# Patient Record
Sex: Male | Born: 1960 | Race: White | Hispanic: No | Marital: Married | Smoking: Never smoker
Health system: Southern US, Community
[De-identification: ages and names within clinical notes are randomized; demographics above are authoritative.]

## PROBLEM LIST (undated history)

## (undated) DIAGNOSIS — R030 Elevated blood-pressure reading, without diagnosis of hypertension: Secondary | ICD-10-CM

## (undated) DIAGNOSIS — E663 Overweight: Secondary | ICD-10-CM

## (undated) DIAGNOSIS — E7800 Pure hypercholesterolemia, unspecified: Secondary | ICD-10-CM

## (undated) HISTORY — DX: Pure hypercholesterolemia, unspecified: E78.00

## (undated) HISTORY — PX: KNEE SURGERY: SHX244

## (undated) HISTORY — DX: Elevated blood-pressure reading, without diagnosis of hypertension: R03.0

## (undated) HISTORY — DX: Overweight: E66.3

---

## 2000-10-25 ENCOUNTER — Other Ambulatory Visit: Admission: RE | Admit: 2000-10-25 | Discharge: 2000-10-25 | Payer: Self-pay | Admitting: Gastroenterology

## 2000-10-25 ENCOUNTER — Encounter (INDEPENDENT_AMBULATORY_CARE_PROVIDER_SITE_OTHER): Payer: Self-pay

## 2005-09-18 ENCOUNTER — Encounter: Admission: RE | Admit: 2005-09-18 | Discharge: 2005-09-18 | Payer: Self-pay | Admitting: Sports Medicine

## 2005-10-02 ENCOUNTER — Encounter: Admission: RE | Admit: 2005-10-02 | Discharge: 2005-10-02 | Payer: Self-pay | Admitting: Sports Medicine

## 2008-01-31 ENCOUNTER — Emergency Department (HOSPITAL_COMMUNITY): Admission: EM | Admit: 2008-01-31 | Discharge: 2008-02-01 | Payer: Self-pay | Admitting: Emergency Medicine

## 2008-12-06 ENCOUNTER — Ambulatory Visit: Payer: Self-pay | Admitting: Internal Medicine

## 2008-12-06 ENCOUNTER — Observation Stay (HOSPITAL_COMMUNITY): Admission: AD | Admit: 2008-12-06 | Discharge: 2008-12-07 | Payer: Self-pay | Admitting: Internal Medicine

## 2008-12-06 DIAGNOSIS — I200 Unstable angina: Secondary | ICD-10-CM | POA: Insufficient documentation

## 2008-12-07 ENCOUNTER — Encounter: Payer: Self-pay | Admitting: Internal Medicine

## 2010-05-11 IMAGING — CR DG CHEST 2V
2 series · 2 of 2 positions shown · non-contrast
Comparison: No priors

CLINICAL DATA: Unstable angina

CHEST - 2 VIEW

[w chest pa *]
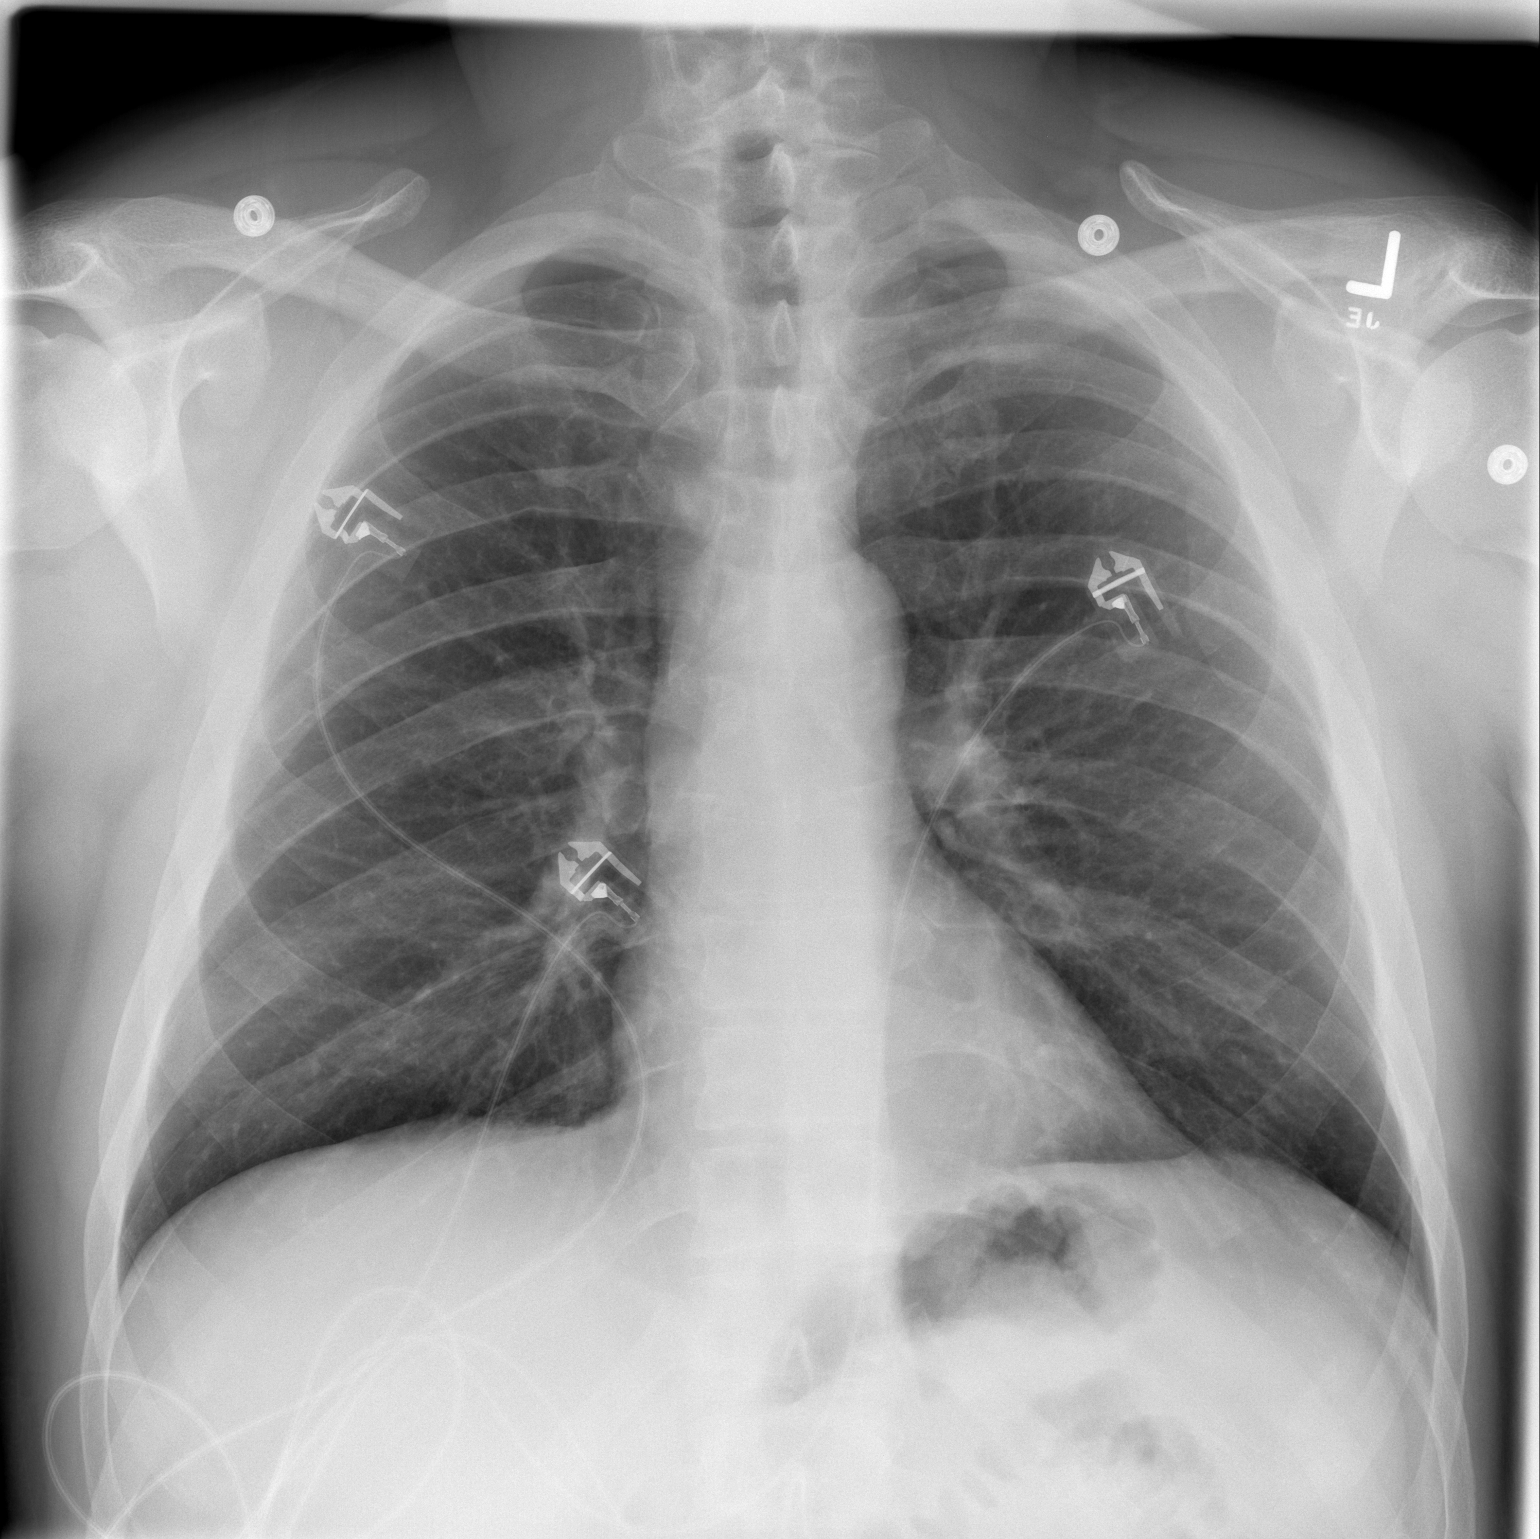

[w chest lat *]
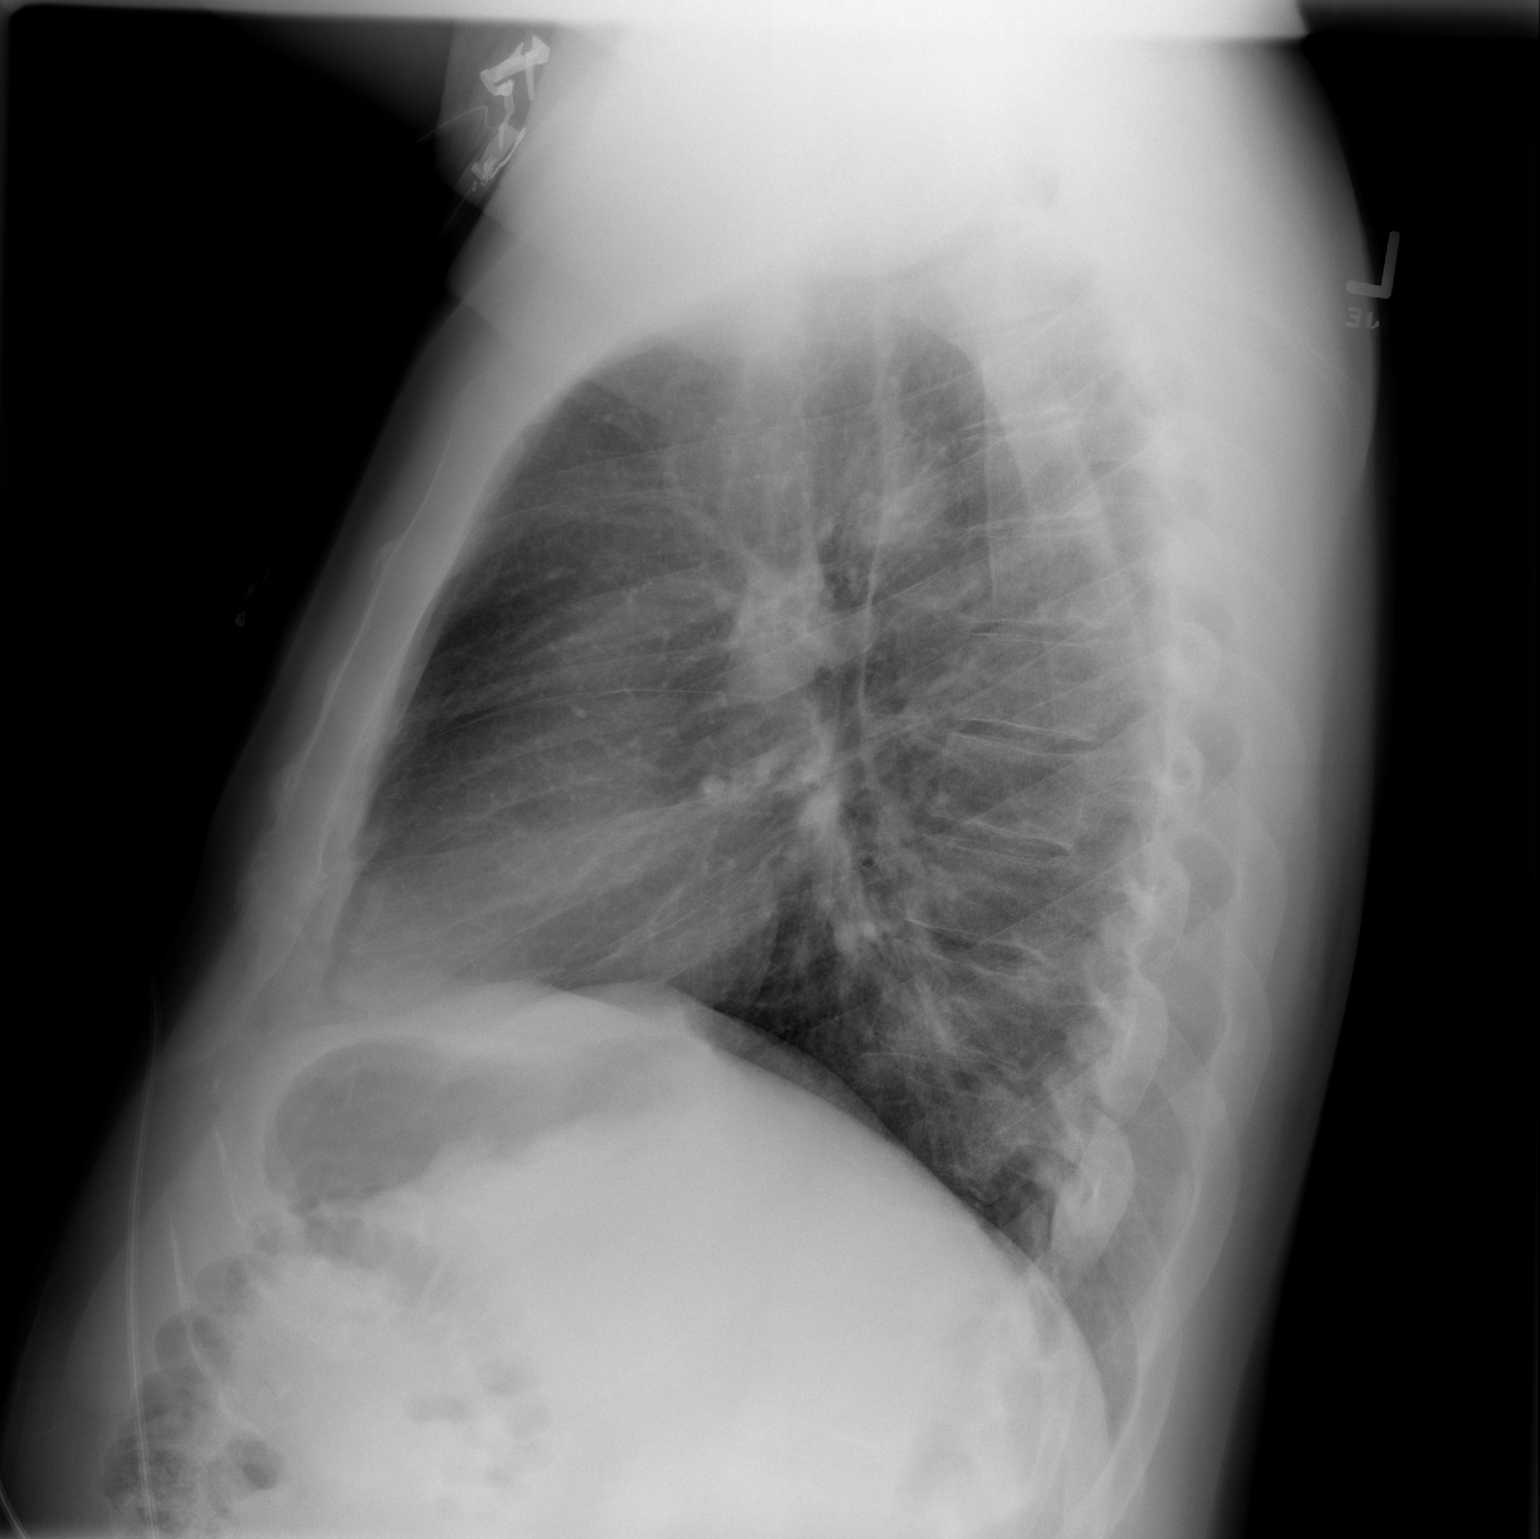

[2 of 2 positions shown; findings below may reference images not displayed]

FINDINGS: Heart and mediastinal contours normal.  Lungs clear.
Slight peribronchial thickening.  No pleural fluid or osseous
lesions.
IMPRESSION: Mild peribronchial thickening - no active disease.

## 2010-08-03 ENCOUNTER — Encounter: Payer: Self-pay | Admitting: Sports Medicine

## 2010-10-21 LAB — COMPREHENSIVE METABOLIC PANEL
ALT: 50 U/L (ref 0–53)
Alkaline Phosphatase: 74 U/L (ref 39–117)
Calcium: 9.3 mg/dL (ref 8.4–10.5)
GFR calc Af Amer: >60 mL/min (ref >60)
GFR calc non Af Amer: >60 mL/min (ref >60)
Total Bilirubin: 0.8 mg/dL (ref 0.3–1.2)
Total Protein: 7.8 g/dL (ref 6.0–8.3)

## 2010-10-21 LAB — CBC
HCT: 46.9 % (ref 39.0–52.0)
Hemoglobin: 16.5 g/dL (ref 13.0–17.0)
Platelets: 194 10*3/uL (ref 150–400)
RDW: 12.1 % (ref 11.5–15.5)

## 2010-10-21 LAB — CARDIAC PANEL(CRET KIN+CKTOT+MB+TROPI)
CK, MB: 2.4 ng/mL (ref 0.3–4.0)
CK, MB: 2.8 ng/mL (ref 0.3–4.0)
CK, MB: 3 ng/mL (ref 0.3–4.0)
Relative Index: 2.4 (ref 0.0–2.5)
Relative Index: 2.5 (ref 0.0–2.5)
Total CK: 112 U/L (ref 7–232)
Troponin I: 0.02 ng/mL (ref 0.00–0.06)

## 2010-10-21 LAB — LIPID PANEL
Cholesterol: 183 mg/dL (ref 0–200)
HDL: 23 mg/dL — ABNORMAL LOW (ref >39)
LDL Cholesterol: 123 mg/dL — ABNORMAL HIGH (ref 0–99)
Total CHOL/HDL Ratio: 8 RATIO
Triglycerides: 185 mg/dL — ABNORMAL HIGH (ref <150)
VLDL: 37 mg/dL (ref 0–40)

## 2010-11-25 NOTE — H&P (Signed)
Dakota Joseph, Dakota Joseph              ACCOUNT NO.:  [id number]   MEDICAL RECORD NO.:  [id number]          PATIENT TYPE:  OBV   LOCATION:  3706                         FACILITY:  [facility]   PHYSICIAN:  Sanda Linger, MD       DATE OF BIRTH:  15-Nov-1960   DATE OF ADMISSION:  12/06/2008  DATE OF DISCHARGE:                              HISTORY & PHYSICAL   CHIEF COMPLAINT:  Dyspnea on exertion.   HISTORY OF PRESENT ILLNESS:  This is a 50 year old male whom I am seeing  for the first time today.  He was previously cared for by Dr. Georgina Pillion at  [place].  He comes in today with a 49-month history of dyspnea on exertion,  substernal chest pain with an occasional radiation to the left upper  extremity.  His most recent concern is about a week or two ago.  He was  at work, he was at rest when he had the sudden onset of severe fatigue  with nausea, diaphoresis, and nearly fainting.  He saw the nurse at work  and was told that he had an irregular heart rate.  He states in the past  when he is pulling the trash can down the driveway, he will get short of  breath and he will be having squeezing pressure under his chest.  That  will go away after 30 minutes of rest.  He has had no cough or  hemoptysis.  He does not report any swelling in his legs.   CURRENT MEDICATIONS:  None.   ALLERGIES:  He has no known drug allergies.   PAST SURGICAL HISTORY:  Significant for tonsillectomy.   PAST MEDICAL HISTORY:  Remarkable for borderline elevation in his blood  pressure which has never been treated.   FAMILY HISTORY:  Positive for premature coronary artery disease in both  his father and mother and second-degree relatives.  There is a family  history of colon cancer, hypertension, and prostate cancer.   SOCIAL HISTORY:  He chews tobacco.  He rarely drinks alcohol.  Does not  smoke cigarettes.  He is married.  Denies use of illicit drugs.   REVIEW OF SYSTEMS:  Negative for any abdominal pain, hematuria, rash,  constipation, diarrhea, bright red blood per rectum, melena, loss of  appetite or weight loss.  He does endorse weight gain and a sedentary  lifestyle.   PHYSICAL EXAMINATION:  GENERAL:  An overweight male.  VITAL SIGNS:  His height is 76 inches, his weight is 262 pounds with a  BMI of 32.01, his pulse ox is 97%, temperature is 97.7, his pulse is 84  and regular, and his blood pressure is 150/90 on a large cuff.  HEENT:  Mucous membranes are pink and moist.  There is no icterus.  Nasopharynx, oropharynx, posterior pharynx show no lesions or exudate.  NECK:  Supple with no lymphadenopathy or JVD.  LUNGS:  Clear anteriorly and posteriorly.  CARDIOVASCULAR:  Regular rhythm without any murmur, rub, or gallop.  ABDOMEN:  Obese, otherwise benign with no hepatosplenomegaly, masses,  tenderness, palpation, rebound, or guarding.  SKIN:  Warm and moist  with no lesions.  EXTREMITIES:  No cyanosis, clubbing, or edema.  Pulses are 2+ and equal  throughout.  He has trace edema in the lower extremities.  Calves are  nontender and nonswollen.  NEUROLOGIC:  Nonfocal.  MUSCULOSKELETAL:  Within normal limits.   LABORATORY DATA:  His EKG shows that he is in normal sinus rhythm.  He  has insignificant Q-waves inferiorly.  He has some flattening of the T-  waves laterally.  There is no ST elevation and no significant Q-waves.   ASSESSMENT:  Unstable angina consistent with an intermediate coronary  syndrome.  He does have multiple risk factors including a family  history, history of hypertension, obesity, chewing tobacco, and  sedentary lifestyle.   PLAN:  He will be admitted to [facility] for telemetry.  He needs to be  monitored for dysrhythmias.  He has left atrial enlargement on the EKG.  So, I am concerned that he may have recently been in and out of atrial  fibrillation.  He also needs to be monitored for cardiac ischemia.  He  will undergo enzyme testing, echocardiogram, and Cardiolite testing.   Initial treatment will begin with IV beta-blocker, topical  nitroglycerin, aspirin, and Lovenox.  Lab testing has been ordered  including cardiac enzymes, CBC, CMP, urinalysis, and TSH.  He will have  a chest x-ray done today and a repeat EKG tomorrow.      Sanda Linger, MD  Electronically Signed     TJ/MEDQ  D:  12/06/2008  T:  12/07/2008  Job:  [id number]

## 2010-11-25 NOTE — Consult Note (Signed)
Dakota Joseph, Dakota Joseph              ACCOUNT NO.:  [id number]   MEDICAL RECORD NO.:  [id number]          PATIENT TYPE:  OBV   LOCATION:  3706                         FACILITY:  [facility]   PHYSICIAN:  Bevelyn Buckles. Bensimhon, MDDATE OF BIRTH:  1960-10-03   DATE OF CONSULTATION:  12/07/2008  DATE OF DISCHARGE:                                 CONSULTATION   CARDIOLOGIST:  Becky Sax. Bensimhon, MD   PRIMARY CARE PHYSICIAN:  Sanda Linger, MD with [facility].   Dakota Joseph is a 50 year old Caucasian gentleman with no history of  coronary artery disease who was actually somewhat active.  He plays  softball 4 nights a week and golf 2 times a week.  He presented on day  of admission complaining of left arm and toothache, some chest tightness  under his left breast, and some increased shortness of breath when he  takes the trash can from his house down to the end of his driveway,  which is up the hill.  He states he started feeling bad last week and  then on Thursday, he was at work, had been working a lot of overtime,  had an episode where he describes really weak, went to see the  occupational nurse.  He states he was told his blood pressure was  elevated like 140/90.  He saw Dr. Yetta Barre yesterday and was admitted for  further evaluation and currently is pain free.  Has had 2 sets of  enzymes that are negative.  Twelve-lead EKG showed no acute findings.   PAST MEDICAL HISTORY:  Borderline hypertension.  He also states that  occupational nurse told him his pulse was somewhat irregular at times.   SURGICAL HISTORY:  Tonsillectomy.  Denies any other health problems.  He  lives in [place] with his spouse.  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  He denies any illicit substance use or alcohol.  He  does chew tobacco some.  For exercise, he enjoys softball and golf.   Mother is alive and well.  Father deceased from complications of cancer.  Sibling alive and well.   REVIEW OF SYSTEMS:   Positive for chest discomfort, dyspnea on exertion,  left arm discomfort as described above with an episode of weakness.   ALLERGIES:  No known drug allergies.   CURRENT MEDICATIONS HERE:  1. Lovenox 40.  2. Lopressor 25 IV q.6 h.  3. Aspirin 325.  4. Nitroglycerin paste.  5. Protonix.   Note, nitroglycerin paste has been removed.  He was on no medications at  home.   PHYSICAL EXAMINATION:  VITAL SIGNS:  Temperature 96.9, heart rate 65,  respirations 16, blood pressure 119/72, sating 95% on room air.  GENERAL:  In no acute distress.  HEENT:  Unremarkable.  NECK:  Supple without lymphadenopathy, bruits, JVD.  CARDIOVASCULAR:  S1, S2.  Regular rate and rhythm.  LUNGS:  Clear to auscultation bilaterally.  SKIN:  Warm and dry.  ABDOMEN:  Soft, nontender.  Positive bowel sounds.  LOWER EXTREMITIES:  Without clubbing, cyanosis, or edema.  NEUROLOGIC:  Alert and oriented x3.   Chest x-ray,  no acute findings.  EKG, sinus rhythm at a rate of 81  without ST or T-wave changes.  Cardiac enzymes negative x2.  Hematocrit  46.9.  Potassium 3.8, creatinine 0.9, TSH 1.715.  Total cholesterol 183,  triglycerides 185, HDL 23, and LDL 123.   The patient with chest discomfort, mostly atypical.  Proceed with stress  Myoview today.  We will have the patient exercise on treadmill, if  negative, no further cardiac workup recommended.  Dr. Arvilla Meres  has been into examine and assess the patient and agrees with the plan of  care.      Dorian Pod, ACNP      Bevelyn Buckles. Bensimhon, MD  Electronically Signed    MB/MEDQ  D:  12/07/2008  T:  12/08/2008  Job:  [id number]

## 2010-11-25 NOTE — Discharge Summary (Signed)
Dakota Joseph, Dakota Joseph              ACCOUNT NO.:  [id number]   MEDICAL RECORD NO.:  [id number]          PATIENT TYPE:  OBV   LOCATION:  3706                         FACILITY:  [facility]   PHYSICIAN:  Valetta Mole. Swords, MD    DATE OF BIRTH:  1961-03-10   DATE OF ADMISSION:  12/06/2008  DATE OF DISCHARGE:  12/07/2008                               DISCHARGE SUMMARY   DISCHARGE DIAGNOSES:  1. Dyspnea.  2. Dyslipidemia.   DISCHARGE MEDICATIONS:  None.   HOSPITAL PROCEDURES:  Echocardiogram and stress Myoview, verbal report  normal.   HOSPITAL CONSULTS:  Cardiology, recommended stress Myoview.   The patient admitted to the Hospitalist Service on Dec 06, 2008.  See  admission note for details.  The patient had a nuclear stress testing on  Dec 07, 2008, demonstrated normal stress nuclear scan.  Results of  echocardiogram are pending.   HOSPITAL LABORATORIES:  Cardiac enzymes remained negative.  Lipid panel  with cholesterol 183, triglycerides 185, HDL 23, LDL 123.  TSH normal at  1.715.  CBC normal.  CMET normal except for the glucose of 111.   HOSPITAL COURSE:  The patient admitted to the Hospitalist Service on Dec 06, 2008.  See admission note for details.  The patient presented with  dyspnea.  He was seen by Cardiology, who recommended echocardiogram and  stress testing.  Stress test result is normal (verbal report).  Echocardiogram is pending.  The patient's symptoms resolved.  He is  ambulating in the hall without difficulty.  It is safe for the patient  to go home.  He will have outpatient followup with Dr. Yetta Barre.   CONDITION ON DISCHARGE:  Improved.      [name], MD  Electronically Signed     Valetta Mole. Swords, MD  Electronically Signed    BHS/MEDQ  D:  12/07/2008  T:  12/08/2008  Job:  [id number]

## 2011-04-10 LAB — URINALYSIS, ROUTINE W REFLEX MICROSCOPIC
Bilirubin Urine: NEGATIVE
Protein, ur: NEGATIVE
Specific Gravity, Urine: 1.024

## 2011-04-10 LAB — DIFFERENTIAL
Basophils Absolute: 0.4 — ABNORMAL HIGH
Basophils Relative: 2 — ABNORMAL HIGH
Eosinophils Absolute: 0
Eosinophils Relative: 0
Lymphs Abs: 1.4
Monocytes Relative: 9
Neutro Abs: 12.6 — ABNORMAL HIGH
Neutrophils Relative %: 80 — ABNORMAL HIGH

## 2011-04-10 LAB — CBC
HCT: 44.8
Hemoglobin: 15.7
MCHC: 35
MCV: 85.1
Platelets: 176
RDW: 11.7

## 2011-04-10 LAB — COMPREHENSIVE METABOLIC PANEL
GFR calc Af Amer: >60
GFR calc non Af Amer: >60
Glucose, Bld: 127 — ABNORMAL HIGH
Total Bilirubin: 1.3 — ABNORMAL HIGH

## 2011-04-10 LAB — LIPASE, BLOOD: Lipase: 17

## 2013-10-17 ENCOUNTER — Encounter (HOSPITAL_COMMUNITY): Payer: Self-pay | Admitting: Emergency Medicine

## 2013-10-17 ENCOUNTER — Emergency Department (HOSPITAL_COMMUNITY)
Admission: EM | Admit: 2013-10-17 | Discharge: 2013-10-17 | Disposition: A | Discharge status: Home / Self Care | Payer: 59 | Attending: Emergency Medicine | Admitting: Emergency Medicine

## 2013-10-17 DIAGNOSIS — Z79899 Other long term (current) drug therapy: Secondary | ICD-10-CM | POA: Insufficient documentation

## 2013-10-17 DIAGNOSIS — R5381 Other malaise: Secondary | ICD-10-CM | POA: Insufficient documentation

## 2013-10-17 DIAGNOSIS — R509 Fever, unspecified: Secondary | ICD-10-CM | POA: Insufficient documentation

## 2013-10-17 DIAGNOSIS — R739 Hyperglycemia, unspecified: Secondary | ICD-10-CM

## 2013-10-17 DIAGNOSIS — R7309 Other abnormal glucose: Secondary | ICD-10-CM | POA: Insufficient documentation

## 2013-10-17 DIAGNOSIS — R1084 Generalized abdominal pain: Secondary | ICD-10-CM | POA: Insufficient documentation

## 2013-10-17 DIAGNOSIS — R112 Nausea with vomiting, unspecified: Secondary | ICD-10-CM

## 2013-10-17 DIAGNOSIS — R197 Diarrhea, unspecified: Secondary | ICD-10-CM | POA: Insufficient documentation

## 2013-10-17 DIAGNOSIS — R5383 Other fatigue: Secondary | ICD-10-CM

## 2013-10-17 LAB — COMPREHENSIVE METABOLIC PANEL
ALT: 52 U/L (ref 0–53)
AST: 25 U/L (ref 0–37)
BUN: 22 mg/dL (ref 6–23)
Creatinine, Ser: 0.88 mg/dL (ref 0.50–1.35)
Potassium: 3.6 mEq/L — ABNORMAL LOW (ref 3.7–5.3)
Total Protein: 7.3 g/dL (ref 6.0–8.3)

## 2013-10-17 LAB — COMPREHENSIVE METABOLIC PANEL WITH GFR
Albumin: 4 g/dL (ref 3.5–5.2)
Alkaline Phosphatase: 75 U/L (ref 39–117)
CO2: 23 meq/L (ref 19–32)
Calcium: 9 mg/dL (ref 8.4–10.5)
Chloride: 100 meq/L (ref 96–112)
GFR calc Af Amer: >90 mL/min (ref >90)
GFR calc non Af Amer: >90 mL/min (ref >90)
Glucose, Bld: 152 mg/dL — ABNORMAL HIGH (ref 70–99)
Sodium: 137 meq/L (ref 137–147)
Total Bilirubin: 1.1 mg/dL (ref 0.3–1.2)

## 2013-10-17 LAB — CBC WITH DIFFERENTIAL/PLATELET
Basophils Absolute: 0 K/uL (ref 0.0–0.1)
Basophils Relative: 0 % (ref 0–1)
Eosinophils Absolute: 0 K/uL (ref 0.0–0.7)
Eosinophils Relative: 0 % (ref 0–5)
HCT: 47.3 % (ref 39.0–52.0)
Hemoglobin: 17.1 g/dL — ABNORMAL HIGH (ref 13.0–17.0)
Lymphocytes Relative: 3 % — ABNORMAL LOW (ref 12–46)
Lymphs Abs: 0.3 K/uL — ABNORMAL LOW (ref 0.7–4.0)
MCH: 30.3 pg (ref 26.0–34.0)
MCHC: 36.2 g/dL — ABNORMAL HIGH (ref 30.0–36.0)
MCV: 83.7 fL (ref 78.0–100.0)
Monocytes Absolute: 0.5 K/uL (ref 0.1–1.0)
Monocytes Relative: 5 % (ref 3–12)
Neutro Abs: 10.8 K/uL — ABNORMAL HIGH (ref 1.7–7.7)
Neutrophils Relative %: 93 % — ABNORMAL HIGH (ref 43–77)
Platelets: 165 10*3/uL (ref 150–400)
RBC: 5.65 MIL/uL (ref 4.22–5.81)
RDW: 12.2 % (ref 11.5–15.5)
WBC: 11.6 10*3/uL — ABNORMAL HIGH (ref 4.0–10.5)

## 2013-10-17 LAB — LIPASE, BLOOD: Lipase: 16 U/L (ref 11–59)

## 2013-10-17 MED ORDER — PANTOPRAZOLE SODIUM 40 MG PO TBEC: 40.0000 mg | DELAYED_RELEASE_TABLET | Freq: Every day | ORAL | Status: DC

## 2013-10-17 MED ORDER — FAMOTIDINE IN NACL 20-0.9 MG/50ML-% IV SOLN
20.0000 mg | Freq: Once | INTRAVENOUS | Status: AC
  Administered 2013-10-17: 20 mg via INTRAVENOUS
  Filled 2013-10-17: qty 50

## 2013-10-17 MED ORDER — ONDANSETRON 8 MG PO TBDP: 8.0000 mg | ORAL_TABLET | Freq: Two times a day (BID) | ORAL | Status: DC | PRN

## 2013-10-17 MED ORDER — SODIUM CHLORIDE 0.9 % IV BOLUS (SEPSIS): 1000.0000 mL | Freq: Once | INTRAVENOUS | Status: DC

## 2013-10-17 MED ORDER — ONDANSETRON HCL 4 MG/2ML IJ SOLN
4.0000 mg | Freq: Once | INTRAMUSCULAR | Status: AC
  Administered 2013-10-17: 4 mg via INTRAVENOUS
  Filled 2013-10-17: qty 2

## 2013-10-17 NOTE — ED Notes (Signed)
Pt presents with NAD. Pt c/o of presenting complaints since 6am. Generalized stomach pain and cramping.

## 2013-10-17 NOTE — ED Provider Notes (Signed)
CSN: [id number]     Arrival date & time 10/17/13  1842 History   First MD Initiated Contact with Patient 10/17/13 1943     Chief Complaint  Patient presents with  . Nausea  . Emesis  . Diarrhea  . Abdominal Pain  . Fever     (Consider location/radiation/quality/duration/timing/severity/associated sxs/prior Treatment) Patient is a 53 y.o. male presenting with vomiting, diarrhea, abdominal pain, and fever. The history is provided by the patient.  Emesis Severity:  Severe Duration:  12 hours Timing:  Constant Quality:  Stomach contents Feeding tolerance: nothing tolerated by mouth, tried liquids, water, juice, gatorade, sprite. Progression:  Unchanged Chronicity:  New Recent urination:  Decreased Context: not post-tussive and not self-induced   Relieved by:  Nothing Associated symptoms: abdominal pain and diarrhea   Associated symptoms: no chills, no fever and no myalgias   Abdominal pain:    Location:  Generalized   Quality:  Cramping   Severity:  Mild   Onset quality:  Gradual   Duration:  12 hours   Timing:  Sporadic   Progression:  Partially resolved   Chronicity:  New Diarrhea:    Quality:  Semi-solid   Severity:  Mild   Duration:  12 hours   Timing:  Intermittent   Progression:  Unchanged Risk factors: no alcohol use, no diabetes, no prior abdominal surgery, no sick contacts, no suspect food intake and no travel to endemic areas   Diarrhea Associated symptoms: abdominal pain and vomiting   Associated symptoms: no chills, no fever and no myalgias   Abdominal Pain Associated symptoms: diarrhea, fatigue, nausea and vomiting   Associated symptoms: no chills, no constipation and no fever   Fever Associated symptoms: diarrhea, nausea and vomiting   Associated symptoms: no chills and no myalgias     History reviewed. No pertinent past medical history. Past Surgical History  Procedure Laterality Date  . Knee surgery     History reviewed. No pertinent family  history. History  Substance Use Topics  . Smoking status: Never Smoker   . Smokeless tobacco: Not on file  . Alcohol Use: Yes    Review of Systems  Constitutional: Positive for fatigue. Negative for fever and chills.  Gastrointestinal: Positive for nausea, vomiting, abdominal pain and diarrhea. Negative for constipation and blood in stool.  Musculoskeletal: Negative for myalgias.  Neurological: Negative for dizziness, syncope and light-headedness.  All other systems reviewed and are negative.      Allergies  Review of patient's allergies indicates no known allergies.  Home Medications   Current Outpatient Rx  Name  Route  Sig  Dispense  Refill  . ibuprofen (ADVIL,MOTRIN) 200 MG tablet   Oral   Take 600 mg by mouth every 6 (six) hours as needed for moderate pain.         Marland Kitchen. ondansetron (ZOFRAN-ODT) 8 MG disintegrating tablet   Oral   Take 1 tablet (8 mg total) by mouth every 12 (twelve) hours as needed for nausea.   20 tablet   0   . pantoprazole (PROTONIX) 40 MG tablet   Oral   Take 1 tablet (40 mg total) by mouth daily.   14 tablet   0    BP 139/86  Pulse 105  Temp(Src) 97.6 F (36.4 C) (Oral)  Resp 16  Ht 6\' 5"  (1.956 m)  Wt 270 lb (122.471 kg)  BMI 32.01 kg/m2  SpO2 97% Physical Exam  Nursing note and vitals reviewed. Constitutional: He is oriented to person, place,  and time. He appears well-developed and well-nourished. No distress.  HENT:  Head: Normocephalic and atraumatic.  Eyes: Conjunctivae and EOM are normal. No scleral icterus.  Neck: Normal range of motion. Neck supple.  Cardiovascular: Normal rate, regular rhythm and intact distal pulses.   Pulmonary/Chest: Effort normal. No respiratory distress. He has no wheezes.  Abdominal: Soft. Bowel sounds are normal. He exhibits no distension. There is no tenderness. There is no rebound, no guarding and no CVA tenderness.  Neurological: He is alert and oriented to person, place, and time. Coordination  normal.  Skin: Skin is warm.  Psychiatric: He has a normal mood and affect.    ED Course  Procedures (including critical care time) Labs Review Labs Reviewed  CBC WITH DIFFERENTIAL - Abnormal; Notable for the following:    WBC 11.6 (*)    Hemoglobin 17.1 (*)    MCHC 36.2 (*)    Neutrophils Relative % 93 (*)    Neutro Abs 10.8 (*)    Lymphocytes Relative 3 (*)    Lymphs Abs 0.3 (*)    All other components within normal limits  COMPREHENSIVE METABOLIC PANEL - Abnormal; Notable for the following:    Potassium 3.6 (*)    Glucose, Bld 152 (*)    All other components within normal limits  LIPASE, BLOOD  URINALYSIS, ROUTINE W REFLEX MICROSCOPIC   Imaging Review No results found.   EKG Interpretation None     RA sat is 97% and I interpret to be adequate  10:14 PM Pt feels improved, is starting to sip on fluids without difficulty so far.  11:02 PM Pt contineus to feel imrpoved, tolerating PO's.  Will d/c home with Rx for protonix and zofran ODT.  Follow up with PMD in 3 days.     MDM   Final diagnoses:  Nausea vomiting and diarrhea  Hyperglycemia    Pt with soft abdomen, N/V/D for 1 day, no sig risks for bacterial infection.  Pt with no sig health concerns at baseline.  Will rehydrate with IVF's, IV antiemetics and will continue to monitor for improvement.  Likely a gastroenteritis.  No fever.  WBC of 11 is non specific.      Dakota Joseph. Shanisha Lech, MD 10/17/13 2302

## 2016-03-03 ENCOUNTER — Encounter: Payer: Self-pay | Admitting: Cardiology

## 2016-03-03 ENCOUNTER — Telehealth: Payer: Self-pay | Admitting: Cardiology

## 2016-03-03 NOTE — Telephone Encounter (Signed)
Called pt to inform him that we have not received his medical records. Pt stated that he would have his wife fax the records over to our office. I advised the pt that if he has any other problems, questions or concerns to call our office. Pt verbalized understanding.

## 2016-03-04 ENCOUNTER — Encounter: Payer: Self-pay | Admitting: Cardiology

## 2016-03-04 ENCOUNTER — Encounter (INDEPENDENT_AMBULATORY_CARE_PROVIDER_SITE_OTHER): Payer: Self-pay

## 2016-03-04 ENCOUNTER — Other Ambulatory Visit: Payer: Self-pay

## 2016-03-04 ENCOUNTER — Ambulatory Visit (INDEPENDENT_AMBULATORY_CARE_PROVIDER_SITE_OTHER): Payer: Commercial Managed Care - HMO | Admitting: Cardiology

## 2016-03-04 DIAGNOSIS — R0789 Other chest pain: Secondary | ICD-10-CM | POA: Diagnosis not present

## 2016-03-04 DIAGNOSIS — R0600 Dyspnea, unspecified: Secondary | ICD-10-CM | POA: Diagnosis not present

## 2016-03-04 NOTE — Patient Instructions (Signed)
We will schedule you for a stress test  

## 2016-03-04 NOTE — Addendum Note (Signed)
Addended by: Neoma LamingPUGH, CHERYL J on: 03/04/2016 04:20 PM   Modules accepted: Orders

## 2016-03-04 NOTE — Progress Notes (Signed)
Dictation #1 [id number]  [id number]    Cardiology Office Note    Date:  03/04/2016   ID:  Dakota DerbyMichael Lee Joseph, DOB 06/16/1961, MRN [id number]  PCP:  Sanda Lingerhomas Jones, MD  Cardiologist:  Peter SwazilandJordan, MD    History of Present Illness:  [name] is a 55 y.o. male self referred for evaluation of dyspnea on exertion. He has a history of borderline elevated blood pressure and hypercholesterolemia. He was evaluated in 2010 with a stress Myoview. He was able to walk 10 minutes on the Bruce protocol without chest pain or Ecg changes. Myoview images were normal. 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 This is more physical and he notes when going up and down steps he gets more SOB than before. He may have mild chest tightness. He sometimes has pain on the inner left arm but this is not exertional. He is fairly sedentary. Plays golf twice a week but otherwise no exercise. Usually does better when he can lose weight.    Past Medical History:  Diagnosis Date  . Borderline high blood pressure   . Hypercholesterolemia   . Overweight     Past Surgical History:  Procedure Laterality Date  . KNEE SURGERY      Current Medications: Outpatient Medications Prior to Visit  Medication Sig Dispense Refill  . ibuprofen (ADVIL,MOTRIN) 200 MG tablet Take 600 mg by mouth every 6 (six) hours as needed for moderate pain.    Marland Kitchen. ondansetron (ZOFRAN-ODT) 8 MG disintegrating tablet Take 1 tablet (8 mg total) by mouth every 12 (twelve) hours as needed for nausea. 20 tablet 0  . pantoprazole (PROTONIX) 40 MG tablet Take 1 tablet (40 mg total) by mouth daily. (Patient not taking: Reported on 03/04/2016) 14 tablet 0   No facility-administered medications prior to visit.      Allergies:   Review of patient's allergies indicates no known allergies.   Social History   Social History  . Marital status: Married    Spouse name: N/A  . Number of children: N/A  . Years of  education: N/A   Occupational History  . maintenance    Social History Main Topics  . Smoking status: Never Smoker  . Smokeless tobacco: Never Used  . Alcohol use Yes  . Drug use: No  . Sexual activity: Not Asked   Other Topics Concern  . None   Social History Narrative  . None     Family History:  The patient's family history includes Hypertension in his mother; Lung cancer in his father.   ROS:   Please see the history of present illness.    ROS All other systems reviewed and are negative.   PHYSICAL EXAM:   VS:  BP 132/84 (BP Location: Left Arm) Comment: 142/90 Right Arm  Pulse 77   Ht 6\' 5"  (1.956 m)   Wt 281 lb (127.5 kg)   BMI 33.32 kg/m    GEN: Well nourished, overweight, in no acute distress  HEENT: normal  Neck: no JVD, carotid bruits, or masses Cardiac: RRR; no murmurs, rubs, or gallops,no edema  Respiratory:  clear to auscultation bilaterally, normal work of breathing GI: soft, nontender, nondistended, + BS MS: no deformity or atrophy  Skin: warm and dry, no rash Neuro:  Alert and Oriented x 3, Strength and sensation are intact Psych: euthymic mood, full affect  Wt Readings from Last 3 Encounters:  03/04/16 281 lb (127.5 kg)  10/17/13 270 lb (122.5 kg)  12/06/08 (!) 262 lb (118.8 kg)      Studies/Labs Reviewed:   EKG:  EKG is ordered today.  The ekg ordered today demonstrates NSR with normal Ecg. I have personally reviewed and interpreted this study.   Recent Labs: No results found for requested labs within last 8760 hours.   Lipid Panel    Component Value Date/Time   CHOL  12/07/2008 0600    183        ATP III CLASSIFICATION:  <200     mg/dL   Desirable  841-324200-239  mg/dL   Borderline High  >=401>=240    mg/dL   High          TRIG [id number] (H) 12/07/2008 0600   HDL 23 (L) 12/07/2008 0600   CHOLHDL 8.0 12/07/2008 0600   VLDL 37 12/07/2008 0600   LDLCALC (H) 12/07/2008 0600    123        Total Cholesterol/HDL:CHD Risk Coronary Heart Disease  Risk Table                     Men   Women  1/2 Average Risk   3.4   3.3  Average Risk       5.0   4.4  2 X Average Risk   9.6   7.1  3 X Average Risk  23.4   11.0        Use the calculated Patient Ratio above and the CHD Risk Table to determine the patient's CHD Risk.        ATP III CLASSIFICATION (LDL):  <100     mg/dL   Optimal  253-664100-129  mg/dL   Near or Above                    Optimal  130-159  mg/dL   Borderline  403-474160-189  mg/dL   High  >259>190     mg/dL   Very High    Additional studies/ records that were reviewed today include:  none    ASSESSMENT:    1. Dyspnea   2. Atypical chest pain      PLAN:  In order of problems listed above:  1. Symptoms are somewhat atypical. I think this is probably related to deconditioning and weight. Will arrange an ETT to assess CV risk. Can compare to 2010. If no ischemia would recommend a regular aerobic exercise program and weight loss. Patient reports lab work done at [facility] but I cannot access. He is planning on follow up with Dr. Yetta BarreJones for primary care.     Medication Adjustments/Labs and Tests Ordered: Current medicines are reviewed at length with the patient today.  Concerns regarding medicines are outlined above.  Medication changes, Labs and Tests ordered today are listed in the Patient Instructions below. Patient Instructions  We will schedule you for a stress test     Signed, Peter SwazilandJordan, MD  03/04/2016 4:07 PM    [facility] [address], [place], [place], [id number] [phone]

## 2016-03-11 ENCOUNTER — Telehealth (HOSPITAL_COMMUNITY): Payer: Self-pay

## 2016-03-11 NOTE — Telephone Encounter (Signed)
Encounter complete. 

## 2016-03-13 ENCOUNTER — Ambulatory Visit (HOSPITAL_COMMUNITY)
Admission: RE | Admit: 2016-03-13 | Discharge: 2016-03-13 | Disposition: A | Discharge status: Home / Self Care | Payer: Commercial Managed Care - HMO | Source: Ambulatory Visit | Attending: Cardiovascular Disease | Admitting: Cardiovascular Disease

## 2016-03-13 DIAGNOSIS — R0600 Dyspnea, unspecified: Secondary | ICD-10-CM

## 2016-03-13 DIAGNOSIS — R0789 Other chest pain: Secondary | ICD-10-CM | POA: Diagnosis not present

## 2016-03-13 DIAGNOSIS — R9439 Abnormal result of other cardiovascular function study: Secondary | ICD-10-CM | POA: Insufficient documentation

## 2016-03-13 LAB — EXERCISE TOLERANCE TEST
CSEPED: 10 min
CSEPEW: 11.7 METS
CSEPHR: 98 %
Exercise duration (sec): 0 s
MPHR: 168 {beats}/min
Peak HR: 166 {beats}/min
RPE: 17
Rest HR: 71 {beats}/min

## 2016-03-18 ENCOUNTER — Telehealth: Payer: Self-pay

## 2016-03-18 NOTE — Telephone Encounter (Signed)
Spoke to patient.Dr.Jordan advised follow up with him in 1 year.Advised to call sooner if needed.

## 2017-04-15 NOTE — Progress Notes (Deleted)
Dictation #1 [id number]  [id number]    Cardiology Office Note    Date:  04/15/2017   ID:  Dakota Joseph, Dakota Joseph 26-Feb-1961, MRN [id number]  PCP:  Etta Grandchild, MD  Cardiologist:  Hani Campusano [place], MD    History of Present Illness:  Dakota Joseph is a 56 y.o. male was self referred for evaluation of dyspnea on exertion. He has a history of borderline elevated blood pressure and hypercholesterolemia. He was evaluated in 2010 with a stress Myoview. He was able to walk 10 minutes on the Bruce protocol without chest pain or Ecg changes. Myoview images were normal. 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 This is more physical and he notes when going up and down steps he gets more SOB than before. He may have mild chest tightness. He sometimes has pain on the inner left arm but this is not exertional. He is fairly sedentary. Plays golf twice a week but otherwise no exercise. Usually does better when he can lose weight.   ETT was performed last year and was stable compared to 2010. Symptoms felt to be related to deconditioning.    Past Medical History:  Diagnosis Date  . Borderline high blood pressure   . Hypercholesterolemia   . Overweight     Past Surgical History:  Procedure Laterality Date  . KNEE SURGERY      Current Medications: Outpatient Medications Prior to Visit  Medication Sig Dispense Refill  . ibuprofen (ADVIL,MOTRIN) 200 MG tablet Take 600 mg by mouth every 6 (six) hours as needed for moderate pain.     No facility-administered medications prior to visit.      Allergies:   Patient has no known allergies.   Social History   Social History  . Marital status: Married    Spouse name: N/A  . Number of children: N/A  . Years of education: N/A   Occupational History  . maintenance    Social History Main Topics  . Smoking status: Never Smoker  . Smokeless tobacco: Never Used  . Alcohol use Yes  . Drug use: No  . Sexual  activity: Not on file   Other Topics Concern  . Not on file   Social History Narrative  . No narrative on file     Family History:  The patient's family history includes Hypertension in his mother; Lung cancer in his father.   ROS:   Please see the history of present illness.    ROS All other systems reviewed and are negative.   PHYSICAL EXAM:   VS:  There were no vitals taken for this visit.   GEN: Well nourished, overweight, in no acute distress  HEENT: normal  Neck: no JVD, carotid bruits, or masses Cardiac: RRR; no murmurs, rubs, or gallops,no edema  Respiratory:  clear to auscultation bilaterally, normal work of breathing GI: soft, nontender, nondistended, + BS MS: no deformity or atrophy  Skin: warm and dry, no rash Neuro:  Alert and Oriented x 3, Strength and sensation are intact Psych: euthymic mood, full affect  Wt Readings from Last 3 Encounters:  03/04/16 281 lb (127.5 kg)  10/17/13 270 lb (122.5 kg)  12/06/08 (!) 262 lb (118.8 kg)      Studies/Labs Reviewed:   EKG:  EKG is ordered today.  The ekg ordered today demonstrates NSR with normal Ecg. I have personally reviewed and interpreted this study.   Recent Labs: No results found for requested labs within last  8760 hours.   Lipid Panel    Component Value Date/Time   CHOL  12/07/2008 0600    183        ATP III CLASSIFICATION:  <200     mg/dL   Desirable  161-096  mg/dL   Borderline High  >=045    mg/dL   High          TRIG 409 (H) 12/07/2008 0600   HDL 23 (L) 12/07/2008 0600   CHOLHDL 8.0 12/07/2008 0600   VLDL 37 12/07/2008 0600   LDLCALC (H) 12/07/2008 0600    123        Total Cholesterol/HDL:CHD Risk Coronary Heart Disease Risk Table                     Men   Women  1/2 Average Risk   3.4   3.3  Average Risk       5.0   4.4  2 X Average Risk   9.6   7.1  3 X Average Risk  23.4   11.0        Use the calculated Patient Ratio above and the CHD Risk Table to determine the patient's CHD  Risk.        ATP III CLASSIFICATION (LDL):  <100     mg/dL   Optimal  811-914  mg/dL   Near or Above                    Optimal  130-159  mg/dL   Borderline  782-956  mg/dL   High  >213     mg/dL   Very High    Additional studies/ records that were reviewed today include:  ETT 03/13/16:  Notes Recorded by Chriss Mannan M [place], MD on 03/13/2016 at 4:41 PM EDT ETT is stable compared to stress test at the time of a Myoview study in 2010. No chest pain. Hypertensive BP response is indicative of poor cardiovascular conditioning. Recommend regular (daily) aerobic exercise.   Molina Hollenback [place] MD, [facility]      Vitals   Height Weight BMI (Calculated)   (1.956 m) 281 lb (127.5 kg) 33.4  Study Highlights     Blood pressure demonstrated a hypertensive response to exercise.  Horizontal ST segment depression ST segment depression was noted during stress in the V6 leads.   HTN response to exercise 1 mm horizontal ST depression in lead V6 peak stress Consider f/u Cardiac CTA or perfusion study  Note this was in setting of HTN response        ASSESSMENT:    No diagnosis found.   PLAN:  In order of problems listed above:  1. Symptoms are somewhat atypical. I think this is probably related to deconditioning and weight. Will arrange an ETT to assess CV risk. Can compare to 2010. If no ischemia would recommend a regular aerobic exercise program and weight loss. Patient reports lab work done at [facility] but I cannot access. He is planning on follow up with Dr. Yetta Barre for primary care.     Medication Adjustments/Labs and Tests Ordered: Current medicines are reviewed at length with the patient today.  Concerns regarding medicines are outlined above.  Medication changes, Labs and Tests ordered today are listed in the Patient Instructions below. There are no Patient Instructions on file for this visit.   Signed, Zinnia Tindall [place], MD  04/15/2017 7:24 AM    [facility] [address], [place], [place],  [address] [phone]  

## 2017-04-19 ENCOUNTER — Ambulatory Visit: Payer: Commercial Managed Care - HMO | Admitting: Cardiology

## 2017-05-04 ENCOUNTER — Encounter: Payer: Self-pay | Admitting: Cardiology

## 2017-05-19 NOTE — Progress Notes (Signed)
Dictation #1 [id number]  [id number]    Cardiology Office Note    Date:  05/20/2017   ID:  Cobi, Delph Feb 22, 1961, MRN [id number]  PCP:  Etta Grandchild, MD  Cardiologist:  Dandra Shambaugh [place], MD    History of Present Illness:  Kolt Mcwhirter is a 56 y.o. male seen for follow up of dyspnea on exertion. He has a history of borderline elevated blood pressure and hypercholesterolemia. He was evaluated in 2010 with a stress Myoview. He was able to walk 10 minutes on the Bruce protocol without chest pain or Ecg changes. Myoview images were normal. When seen last year he complained of dyspnea on exertion and some chest tightness. We ordered an ETT and demonstrated a hypertensive response with exercise but really unchanged from 2010. This was felt to be indicative of poor conditioning. Regular exercise and weight loss recommended.   Since last year he has really changed his diet. Quit eating sausage/egg biscuits for breakfast and hamburgers and fries at lunch. Not drinking sodas. Eating oatmeal in am and salad at lunch. No longer craving the fatty foods. Has lost 10 lbs. Still hasn't increased aerobic activity. Playing golf mostly.    Past Medical History:  Diagnosis Date  . Borderline high blood pressure   . Hypercholesterolemia   . Overweight     Past Surgical History:  Procedure Laterality Date  . KNEE SURGERY      Current Medications: Outpatient Medications Prior to Visit  Medication Sig Dispense Refill  . ibuprofen (ADVIL,MOTRIN) 200 MG tablet Take 600 mg by mouth every 6 (six) hours as needed for moderate pain.     No facility-administered medications prior to visit.      Allergies:   Patient has no known allergies.   Social History   Socioeconomic History  . Marital status: Married    Spouse name: None  . Number of children: None  . Years of education: None  . Highest education level: None  Social Needs  . Financial resource strain: None  . Food  insecurity - worry: None  . Food insecurity - inability: None  . Transportation needs - medical: None  . Transportation needs - non-medical: None  Occupational History  . Occupation: maintenance  Tobacco Use  . Smoking status: Never Smoker  . Smokeless tobacco: Never Used  Substance and Sexual Activity  . Alcohol use: Yes  . Drug use: No  . Sexual activity: None  Other Topics Concern  . None  Social History Narrative  . None     Family History:  The patient's family history includes Hypertension in his mother; Lung cancer in his father.   ROS:   Please see the history of present illness.    ROS All other systems reviewed and are negative.   PHYSICAL EXAM:   VS:  BP (!) 162/96   Pulse 70   Ht 6\' 4"  (1.93 m)   Wt 270 lb 12.8 oz (122.8 kg)   BMI 32.96 kg/m    GENERAL:  Well appearing, overweight HEENT:  PERRL, EOMI, sclera are clear. Oropharynx is clear. NECK:  No jugular venous distention, carotid upstroke brisk and symmetric, no bruits, no thyromegaly or adenopathy LUNGS:  Clear to auscultation bilaterally CHEST:  Unremarkable HEART:  RRR,  PMI not displaced or sustained,S1 and S2 within normal limits, no S3, no S4: no clicks, no rubs, no murmurs ABD:  Soft, nontender. BS +, no masses or bruits. No hepatomegaly, no splenomegaly EXT:  2 + pulses  throughout, no edema, no cyanosis no clubbing SKIN:  Warm and dry.  No rashes NEURO:  Alert and oriented x 3. Cranial nerves II through XII intact. PSYCH:  Cognitively intact    Wt Readings from Last 3 Encounters:  05/20/17 270 lb 12.8 oz (122.8 kg)  03/04/16 281 lb (127.5 kg)  10/17/13 270 lb (122.5 kg)      Studies/Labs Reviewed:   EKG:  EKG is  ordered today.  The ekg ordered today demonstrates NSR with normal Ecg. I have personally reviewed and interpreted this study.   Recent Labs: No results found for requested labs within last 8760 hours.   Lipid Panel    Component Value Date/Time   CHOL  12/07/2008 0600      183        ATP III CLASSIFICATION:  <200     mg/dL   Desirable  161-096200-239  mg/dL   Borderline High  >=045>=240    mg/dL   High          TRIG 409185 (H) 12/07/2008 0600   HDL 23 (L) 12/07/2008 0600   CHOLHDL 8.0 12/07/2008 0600   VLDL 37 12/07/2008 0600   LDLCALC (H) 12/07/2008 0600    123        Total Cholesterol/HDL:CHD Risk Coronary Heart Disease Risk Table                     Men   Women  1/2 Average Risk   3.4   3.3  Average Risk       5.0   4.4  2 X Average Risk   9.6   7.1  3 X Average Risk  23.4   11.0        Use the calculated Patient Ratio above and the CHD Risk Table to determine the patient's CHD Risk.        ATP III CLASSIFICATION (LDL):  <100     mg/dL   Optimal  811-914100-129  mg/dL   Near or Above                    Optimal  130-159  mg/dL   Borderline  782-956160-189  mg/dL   High  >213>190     mg/dL   Very High    Additional studies/ records that were reviewed today include:  ETT 03/13/16: Study Highlights     Blood pressure demonstrated a hypertensive response to exercise.  Horizontal ST segment depression ST segment depression was noted during stress in the V6 leads.   HTN response to exercise 1 mm horizontal ST depression in lead V6 peak stress Consider f/u Cardiac CTA or perfusion study  Note this was in setting of HTN response       ASSESSMENT:    1. Dyspnea, unspecified type      PLAN:  In order of problems listed above:  1. ETT last year was stable from 2010 but did show deconditioning with hypertensive BP response. He can tell a big difference in his dyspnea with his weight loss. Goal now is to increase his aerobic activity. I will follow up in one year.    Medication Adjustments/Labs and Tests Ordered: Current medicines are reviewed at length with the patient today.  Concerns regarding medicines are outlined above.  Medication changes, Labs and Tests ordered today are listed in the Patient Instructions below. Patient Instructions  Continue your  current therapy  Focus on increased aerobic activity  Signed, Benney Sommerville [place], MD  05/20/2017 8:31 AM    [facility] [address], [place], [place], [id number] [phone]

## 2017-05-20 ENCOUNTER — Ambulatory Visit: Payer: 59 | Admitting: Cardiology

## 2017-05-20 ENCOUNTER — Encounter: Payer: Self-pay | Admitting: Cardiology

## 2017-05-20 VITALS — BP 162/96 | HR 70 | Ht 76.0 in | Wt 270.8 lb

## 2017-05-20 DIAGNOSIS — R0600 Dyspnea, unspecified: Secondary | ICD-10-CM

## 2017-05-20 NOTE — Patient Instructions (Signed)
Continue your current therapy  Focus on increased aerobic activity

## 2019-05-02 ENCOUNTER — Telehealth: Payer: Self-pay | Admitting: Cardiology

## 2019-05-02 NOTE — Telephone Encounter (Signed)
LMTCB to schedule appt with Dr. Jordan. 

## 2019-05-22 NOTE — Progress Notes (Signed)
Dictation #1 [id number]  [id number]    Cardiology Office Note    Date:  05/24/2019   ID:  Dakota Joseph, Dakota Joseph 12-09-1960, MRN [id number]  PCP:  Patient, No Pcp Per  Cardiologist:  Jadalee Westcott [place], MD    History of Present Illness:  Dakota Joseph is a 58 y.o. male seen for follow up. Last seen 2 years ago. He has a history of borderline elevated blood pressure and hypercholesterolemia. He was evaluated in 2010 with a stress Myoview. He was able to walk 10 minutes on the Bruce protocol without chest pain or Ecg changes. Myoview images were normal. When seen In 2017 he complained of dyspnea on exertion and some chest tightness. We ordered an ETT and demonstrated a hypertensive response with exercise but really unchanged from 2010. This was felt to be indicative of poor conditioning. Regular exercise and weight loss recommended.   When last seen he had changed his diet, lost weight and felt much better.  On follow up today he is very stressed out at work. No breaks or vacation. 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 Is eating poorly again and has gained 15 lbs. Feels terrible. Having chest pressure and tightness with stress. Can't sleep. SOB going up stairs. Doesn't think he can handle his job much longer.    Past Medical History:  Diagnosis Date  . Borderline high blood pressure   . Hypercholesterolemia   . Overweight     Past Surgical History:  Procedure Laterality Date  . KNEE SURGERY      Current Medications: Outpatient Medications Prior to Visit  Medication Sig Dispense Refill  . ibuprofen (ADVIL,MOTRIN) 200 MG tablet Take 600 mg by mouth every 6 (six) hours as needed for moderate pain.     No facility-administered medications prior to visit.      Allergies:   Patient has no known allergies.   Social History   Socioeconomic History  . Marital status: Married    Spouse name: Not on file  . Number of children: Not on file  . Years of education:  Not on file  . Highest education level: Not on file  Occupational History  . Occupation: maintenance  Social Needs  . Financial resource strain: Not on file  . Food insecurity    Worry: Not on file    Inability: Not on file  . Transportation needs    Medical: Not on file    Non-medical: Not on file  Tobacco Use  . Smoking status: Never Smoker  . Smokeless tobacco: Never Used  Substance and Sexual Activity  . Alcohol use: Yes  . Drug use: No  . Sexual activity: Not on file  Lifestyle  . Physical activity    Days per week: Not on file    Minutes per session: Not on file  . Stress: Not on file  Relationships  . Social Musician on phone: Not on file    Gets together: Not on file    Attends religious service: Not on file    Active member of club or organization: Not on file    Attends meetings of clubs or organizations: Not on file    Relationship status: Not on file  Other Topics Concern  . Not on file  Social History Narrative  . Not on file     Family History:  The patient's family history includes Hypertension in his mother; Lung cancer in his father.   ROS:  Please see the history of present illness.    ROS All other systems reviewed and are negative.   PHYSICAL EXAM:   VS:  BP (!) 161/104 (BP Location: Right Arm)   Pulse 80   Temp (!) 97 F (36.1 C)   Ht 6\' 4"  (1.93 m)   Wt 282 lb 3.2 oz (128 kg)   SpO2 98%   BMI 34.35 kg/m    GENERAL:  Well appearing, overweight HEENT:  PERRL, EOMI, sclera are clear. Oropharynx is clear. NECK:  No jugular venous distention, carotid upstroke brisk and symmetric, no bruits, no thyromegaly or adenopathy LUNGS:  Clear to auscultation bilaterally CHEST:  Unremarkable HEART:  RRR,  PMI not displaced or sustained,S1 and S2 within normal limits, no S3, no S4: no clicks, no rubs, no murmurs ABD:  Soft, nontender. BS +, no masses or bruits. No hepatomegaly, no splenomegaly EXT:  2 + pulses throughout, no edema, no  cyanosis no clubbing SKIN:  Warm and dry.  No rashes NEURO:  Alert and oriented x 3. Cranial nerves II through XII intact. PSYCH:  Cognitively intact    Wt Readings from Last 3 Encounters:  05/24/19 282 lb 3.2 oz (128 kg)  05/20/17 270 lb 12.8 oz (122.8 kg)  03/04/16 281 lb (127.5 kg)      Studies/Labs Reviewed:   EKG:  EKG is  ordered today.  The ekg ordered today demonstrates NSR rate 76. Normal. I have personally reviewed and interpreted this study.    Recent Labs: No results found for requested labs within last 8760 hours.   Lipid Panel    Component Value Date/Time   CHOL  12/07/2008 0600    183        ATP III CLASSIFICATION:  <200     mg/dL   Desirable  200-239  mg/dL   Borderline High  >=240    mg/dL   High          TRIG 185 (H) 12/07/2008 0600   HDL 23 (L) 12/07/2008 0600   CHOLHDL 8.0 12/07/2008 0600   VLDL 37 12/07/2008 0600   LDLCALC (H) 12/07/2008 0600    123        Total Cholesterol/HDL:CHD Risk Coronary Heart Disease Risk Table                     Men   Women  1/2 Average Risk   3.4   3.3  Average Risk       5.0   4.4  2 X Average Risk   9.6   7.1  3 X Average Risk  23.4   11.0        Use the calculated Patient Ratio above and the CHD Risk Table to determine the patient's CHD Risk.        ATP III CLASSIFICATION (LDL):  <100     mg/dL   Optimal  100-129  mg/dL   Near or Above                    Optimal  130-159  mg/dL   Borderline  160-189  mg/dL   High  >190     mg/dL   Very High    Additional studies/ records that were reviewed today include:  ETT 03/13/16: Study Highlights     Blood pressure demonstrated a hypertensive response to exercise.  Horizontal ST segment depression ST segment depression was noted during stress in the V6 leads.   HTN response  to exercise 1 mm horizontal ST depression in lead V6 peak stress Consider f/u Cardiac CTA or perfusion study  Note this was in setting of HTN response       ASSESSMENT:    1.  Precordial chest pain   2. Essential hypertension   3. Dyslipidemia      PLAN:  In order of problems listed above: I have given him an excuse to be out of work for 3 weeks. Will check blood work today. Initiate losartan 50 mg daily for BP. Work on getting back to a healthy diet and weight loss. Will arrange for a coronary CTA to evaluate his chest pain.    Medication Adjustments/Labs and Tests Ordered: Current medicines are reviewed at length with the patient today.  Concerns regarding medicines are outlined above.  Medication changes, Labs and Tests ordered today are listed in the Patient Instructions below. Patient Instructions  Start losartan 50 mg daily for blood pressure  We will check labs today  We will schedule you for a CT angiogram to look at the coronary arteries.   Start working on a healthier diet, weight loss and some exercise.     Signed, Onofrio Klemp [place], MD  05/24/2019 3:14 PM    [facility] [address], [place], [place], [id number] [phone]

## 2019-05-24 ENCOUNTER — Encounter: Payer: Self-pay | Admitting: Cardiology

## 2019-05-24 ENCOUNTER — Ambulatory Visit (INDEPENDENT_AMBULATORY_CARE_PROVIDER_SITE_OTHER): Payer: 59 | Admitting: Cardiology

## 2019-05-24 ENCOUNTER — Other Ambulatory Visit: Payer: Self-pay

## 2019-05-24 VITALS — BP 161/104 | HR 80 | Temp 97.0°F | Ht 76.0 in | Wt 282.2 lb

## 2019-05-24 DIAGNOSIS — I10 Essential (primary) hypertension: Secondary | ICD-10-CM

## 2019-05-24 DIAGNOSIS — R072 Precordial pain: Secondary | ICD-10-CM

## 2019-05-24 DIAGNOSIS — E785 Hyperlipidemia, unspecified: Secondary | ICD-10-CM

## 2019-05-24 MED ORDER — METOPROLOL TARTRATE 100 MG PO TABS: ORAL_TABLET | ORAL | 0 refills | Status: DC

## 2019-05-24 MED ORDER — LOSARTAN POTASSIUM 50 MG PO TABS: 50.0000 mg | ORAL_TABLET | Freq: Every day | ORAL | 3 refills | Status: AC

## 2019-05-24 NOTE — Patient Instructions (Addendum)
Start losartan 50 mg daily for blood pressure  We will check labs today  We will schedule you for a CT angiogram to look at the coronary arteries.   Start working on a healthier diet, weight loss and some exercise.       Your cardiac CT will be scheduled at one of the below locations:   [facility] [address] [place], [place] [address] (336) [place] [address] [place], [place] [address] [phone]  If scheduled at [facility], please arrive at the [facility] main entrance of [facility] 30-45 minutes prior to test start time. Proceed to the [facility] Radiology Department (first floor) to check-in and test prep.  If scheduled at [facility], please arrive 15 mins early for check-in and test prep.  Please follow these instructions carefully (unless otherwise directed):  Hold all erectile dysfunction medications at least 3 days (72 hrs) prior to test.  On the Night Before the Test: . Be sure to Drink plenty of water. . Do not consume any caffeinated/decaffeinated beverages or chocolate 12 hours prior to your test. . Do not take any antihistamines 12 hours prior to your test.  On the Day of the Test: . Drink plenty of water. Do not drink any water within one hour of the test. . Do not eat any food 4 hours prior to the test. . You may take your regular medications prior to the test.  . Take metoprolol 100 mg two hours prior to test.         After the Test: . Drink plenty of water. . After receiving IV contrast, you may experience a mild flushed feeling. This is normal. . On occasion, you may experience a mild rash up to 24 hours after the test. This is not dangerous. If this occurs, you can take Benadryl 25 mg and increase your fluid intake. . If you experience trouble breathing, this can be serious. If it is severe call 911 IMMEDIATELY. If  it is mild, please call our office.    Once we have confirmed authorization from your insurance company, we will call you to set up a date and time for your test.   For non-scheduling related questions, please contact the cardiac imaging nurse navigator should you have any questions/concerns: Marchia Bond, RN Navigator Cardiac Imaging [facility] [phone] Office

## 2019-05-25 LAB — BASIC METABOLIC PANEL
BUN/Creatinine Ratio: 13 (ref 9–20)
BUN: 13 mg/dL (ref 6–24)
CO2: 25 mmol/L (ref 20–29)
Calcium: 9.6 mg/dL (ref 8.7–10.2)
Chloride: 101 mmol/L (ref 96–106)
Creatinine, Ser: 1.04 mg/dL (ref 0.76–1.27)
GFR calc Af Amer: 92 mL/min/{1.73_m2} (ref >59)
GFR calc non Af Amer: 80 mL/min/{1.73_m2} (ref >59)
Glucose: 93 mg/dL (ref 65–99)
Potassium: 4.6 mmol/L (ref 3.5–5.2)
Sodium: 141 mmol/L (ref 134–144)

## 2019-05-25 LAB — CBC WITH DIFFERENTIAL/PLATELET
Basophils Absolute: 0 10*3/uL (ref 0.0–0.2)
Basos: 0 %
EOS (ABSOLUTE): 0.1 10*3/uL (ref 0.0–0.4)
Eos: 1 %
Hematocrit: 49.6 % (ref 37.5–51.0)
Hemoglobin: 16.7 g/dL (ref 13.0–17.7)
Immature Grans (Abs): 0 10*3/uL (ref 0.0–0.1)
Immature Granulocytes: 0 %
Lymphocytes Absolute: 2.6 10*3/uL (ref 0.7–3.1)
Lymphs: 30 %
MCH: 29.9 pg (ref 26.6–33.0)
MCHC: 33.7 g/dL (ref 31.5–35.7)
MCV: 89 fL (ref 79–97)
Monocytes Absolute: 0.7 10*3/uL (ref 0.1–0.9)
Monocytes: 8 %
Neutrophils Absolute: 5 10*3/uL (ref 1.4–7.0)
Neutrophils: 61 %
Platelets: 198 10*3/uL (ref 150–450)
RBC: 5.59 x10E6/uL (ref 4.14–5.80)
RDW: 12.1 % (ref 11.6–15.4)
WBC: 8.4 10*3/uL (ref 3.4–10.8)

## 2019-05-25 LAB — HEPATIC FUNCTION PANEL
ALT: 50 IU/L — ABNORMAL HIGH (ref 0–44)
AST: 28 IU/L (ref 0–40)
Albumin: 4.7 g/dL (ref 3.8–4.9)
Alkaline Phosphatase: 82 IU/L (ref 39–117)
Bilirubin Total: 0.6 mg/dL (ref 0.0–1.2)
Bilirubin, Direct: 0.12 mg/dL (ref 0.00–0.40)
Total Protein: 7.4 g/dL (ref 6.0–8.5)

## 2019-05-25 LAB — LIPID PANEL
Chol/HDL Ratio: 7.9 ratio — ABNORMAL HIGH (ref 0.0–5.0)
Cholesterol, Total: 228 mg/dL — ABNORMAL HIGH (ref 100–199)
HDL: 29 mg/dL — ABNORMAL LOW (ref >39)
LDL Chol Calc (NIH): 119 mg/dL — ABNORMAL HIGH (ref 0–99)
Triglycerides: 455 mg/dL — ABNORMAL HIGH (ref 0–149)
VLDL Cholesterol Cal: 80 mg/dL — ABNORMAL HIGH (ref 5–40)

## 2019-05-25 LAB — TSH: TSH: 2.55 u[IU]/mL (ref 0.450–4.500)

## 2019-06-14 ENCOUNTER — Telehealth: Payer: Self-pay | Admitting: Cardiology

## 2019-06-14 NOTE — Telephone Encounter (Signed)
Routed to primary nurse 

## 2019-06-14 NOTE — Telephone Encounter (Signed)
Dr. Martinique wrote the patient a letter to be cleared from work until December 7. The wife wants to know if Dr. Martinique can extend that letter until January 4. The patient still has not had any of his testing done yet and does not want to go back to work until he is done with the tests.  The patient is trying to finalize papers with his work and Medical Records, but that paperwork is not done yet. He does not want to worry about his work denying time off   The wife can come to the office to pick up the letter whenever it is ready.

## 2019-06-15 NOTE — Telephone Encounter (Signed)
Patient's wife calling because she needs the letter by tomorrow. She states that he cannot wait til Monday, he is risking getting fired.

## 2019-06-15 NOTE — Telephone Encounter (Signed)
Sent to MD

## 2019-06-15 NOTE — Telephone Encounter (Signed)
Returned call to wife left message on personal voice mail I will ask Dr.Jordan about letter when he is back in office 12/7 and I will call her back.

## 2019-06-15 NOTE — Telephone Encounter (Signed)
Per pt call following up on his FMLA paper work he wants to pick them up this week if possible. and he would like the work release letter to be extended to January 4th 2021 not 06/19/19 ASAP. Per pt the dates can not lapce and he would like to pick up the new letter please.  Please give pt a call with any questions.

## 2019-06-16 NOTE — Telephone Encounter (Signed)
Wife walked in office this morning.She stated husband needs a letter to return to work 07/17/19.Stated he needs to turn in today or he will lose his job.Letter was given to wife.

## 2019-07-26 ENCOUNTER — Telehealth (HOSPITAL_COMMUNITY): Payer: Self-pay | Admitting: Emergency Medicine

## 2019-07-26 NOTE — Telephone Encounter (Signed)
Reaching out to patient to offer assistance regarding upcoming cardiac imaging study; pt verbalizes understanding of appt date/time, parking situation and where to check in, pre-test NPO status and medications ordered, and verified current allergies; name and call back number provided for further questions should they arise Hairo Garraway RN Navigator Cardiac Imaging [facility] [phone] office [phone] cell 

## 2019-07-27 ENCOUNTER — Ambulatory Visit (HOSPITAL_COMMUNITY)
Admission: RE | Admit: 2019-07-27 | Discharge: 2019-07-27 | Disposition: A | Discharge status: Home / Self Care | Payer: 59 | Source: Ambulatory Visit | Attending: Cardiology | Admitting: Cardiology

## 2019-07-27 ENCOUNTER — Other Ambulatory Visit: Payer: Self-pay

## 2019-07-27 DIAGNOSIS — Z006 Encounter for examination for normal comparison and control in clinical research program: Secondary | ICD-10-CM

## 2019-07-27 DIAGNOSIS — R072 Precordial pain: Secondary | ICD-10-CM

## 2019-07-27 MED ORDER — NITROGLYCERIN 0.4 MG SL SUBL
0.8000 mg | SUBLINGUAL_TABLET | Freq: Once | SUBLINGUAL | Status: AC
  Administered 2019-07-27: 0.8 mg via SUBLINGUAL

## 2019-07-27 MED ORDER — IOHEXOL 350 MG/ML SOLN
80.0000 mL | Freq: Once | INTRAVENOUS | Status: AC | PRN
  Administered 2019-07-27: 80 mL via INTRAVENOUS

## 2019-07-27 MED ORDER — NITROGLYCERIN 0.4 MG SL SUBL
SUBLINGUAL_TABLET | SUBLINGUAL | Status: AC
  Filled 2019-07-27: qty 2

## 2019-07-27 NOTE — Research (Signed)
Cadfem Informed Consent    Patient Name: Dakota Joseph   Subject met inclusion and exclusion criteria.  The informed consent form, study requirements and expectations were reviewed with the subject and questions and concerns were addressed prior to the signing of the consent form.  The subject verbalized understanding of the trail requirements.  The subject agreed to participate in the CADFEM trial and signed the informed consent.  The informed consent was obtained prior to performance of any protocol-specific procedures for the subject.  A copy of the signed informed consent was given to the subject and a copy was placed in the subject's medical record.   Neva Seat

## 2019-08-10 ENCOUNTER — Ambulatory Visit: Payer: 59 | Admitting: Family Medicine

## 2019-08-10 ENCOUNTER — Other Ambulatory Visit: Payer: Self-pay

## 2019-08-10 ENCOUNTER — Encounter (INDEPENDENT_AMBULATORY_CARE_PROVIDER_SITE_OTHER): Payer: Self-pay

## 2019-08-10 ENCOUNTER — Encounter: Payer: Self-pay | Admitting: Physician Assistant

## 2019-08-10 VITALS — BP 148/91 | HR 78 | Temp 98.1°F | Ht 76.0 in | Wt 284.6 lb

## 2019-08-10 DIAGNOSIS — I10 Essential (primary) hypertension: Secondary | ICD-10-CM

## 2019-08-10 DIAGNOSIS — E785 Hyperlipidemia, unspecified: Secondary | ICD-10-CM | POA: Diagnosis not present

## 2019-08-10 DIAGNOSIS — R072 Precordial pain: Secondary | ICD-10-CM

## 2019-08-10 MED ORDER — ATORVASTATIN CALCIUM 10 MG PO TABS: 10.0000 mg | ORAL_TABLET | Freq: Every day | ORAL | 0 refills | Status: DC

## 2019-08-10 NOTE — Progress Notes (Signed)
Cardiology Office Note  Date: 08/10/2019   ID: Dakota Joseph [birth year], MRN [id number]  PCP:  Patient, No Pcp Per  Cardiologist:  Peter Martinique, MD Electrophysiologist:  None   No chief complaint on file.   History of Present Illness: Dakota Joseph is a 59 y.o. male last encounter with Dr. Martinique on May 24, 2019 for follow-up visit.  History of borderline elevated blood pressure and hyperlipidemia.  Previous stress test in 2010.  Myoview images were normal he walked for 10 minutes DOB Bruce protocol without chest pain or EKG changes.  He was seen again in 2017 complaining of dyspnea on exertion and some chest tightness.  Patient had an exercise treadmill demonstrating hypertension spots to exercise but unchanged from previous stress.  The symptoms were felt to be a result of poor conditioning.  Regular exercise and weight loss was recommended at that visit.  Patient apparently had changed his diet and lost weight and admitted to feeling better.  However he was stressed out at work.  He has gained 15 pounds, eating poorly, experiencing insomnia, dyspnea on ascending stairs.  He was started on losartan 50 mg daily for blood pressure.  A coronary CTA was ordered to evaluate chest pain.  Coronary CT a was negative with no significant coronary artery disease and calcium score of 0.  FFR is pending.   Past Medical History:  Diagnosis Date  . Borderline high blood pressure   . Hypercholesterolemia   . Overweight     Past Surgical History:  Procedure Laterality Date  . KNEE SURGERY      Current Outpatient Medications  Medication Sig Dispense Refill  . ibuprofen (ADVIL,MOTRIN) 200 MG tablet Take 600 mg by mouth every 6 (six) hours as needed for moderate pain.    Marland Kitchen losartan (COZAAR) 50 MG tablet Take 1 tablet (50 mg total) by mouth daily. 90 tablet 3  . metoprolol tartrate (LOPRESSOR) 100 MG tablet Take 2 hours before Coronary CT 1 tablet 0   No current  facility-administered medications for this visit.   Allergies:  Patient has no known allergies.   Social History: The patient  reports that he has never smoked. He has never used smokeless tobacco. He reports current alcohol use. He reports that he does not use drugs.   Family History: The patient's family history includes Hypertension in his mother; Lung cancer in his father.   ROS:  Please see the history of present illness. Otherwise, complete review of systems is positive for none.  All other systems are reviewed and negative.   Physical Exam: VS:  There were no vitals taken for this visit., BMI There is no height or weight on file to calculate BMI.  Wt Readings from Last 3 Encounters:  05/24/19 282 lb 3.2 oz (128 kg)  05/20/17 270 lb 12.8 oz (122.8 kg)  03/04/16 281 lb (127.5 kg)    General: Patient appears comfortable at rest. Neck: Supple, no elevated JVP or carotid bruits, no thyromegaly. Lungs: Clear to auscultation, nonlabored breathing at rest. Cardiac: Regular rate and rhythm, no S3 or significant systolic murmur, no pericardial rub. Extremities: No pitting edema, distal pulses 2+. Skin: Warm and dry. Neuropsychiatric: Alert and oriented x3, affect grossly appropriate.  ECG:  An ECG dated 05/26/2019 was personally reviewed today and demonstrated:  Normal sinus rhythm rate of 76 possible left atrial enlargement.  Recent Labwork: 05/24/2019: ALT 50; AST 28; BUN 13; Creatinine, Ser 1.04; Hemoglobin 16.7; Platelets 198; Potassium 4.6;  Sodium 141; TSH 2.550     Component Value Date/Time   CHOL 228 (H) 05/24/2019 1537   TRIG 455 (H) 05/24/2019 1537   HDL 29 (L) 05/24/2019 1537   CHOLHDL 7.9 (H) 05/24/2019 1537   CHOLHDL 8.0 12/07/2008 0600   VLDL 37 12/07/2008 0600   LDLCALC 119 (H) 05/24/2019 1537    Other Studies Reviewed Today:   Coronary CT angiogram 07/27/2019 yet ye normal left like a real signature like that non-cardiac: See separate report from [facility]  Radiology.  Pulmonary veins drain normally to the left atrium. No LA appendage thrombus.  Calcium Score: 0 Agatston units.  Coronary Arteries: Right dominant with no anomalies  LM: No plaque or stenosis.  LAD system:  No plaque or stenosis.  Circumflex system: No plaque or stenosis.  RCA system: No plaque or stenosis.  IMPRESSION: 1. Coronary artery calcium score 0 Agatston units, suggesting low risk for future cardiac events.  2.  No significant coronary disease.  Assessment and Plan:  1. ANGINA, UNSTABLE   2. Dyspnea, unspecified type   3. Atypical chest pain    .1. Precordial chest pain Patient states he is not having any more chest pain.  The resign from his current position to a less stressful position and states he loves his new job.  Discussed recent coronary CT scan with patient which showed 0 calcium score and no evidence of coronary artery disease.  2. Essential hypertension Blood pressure slightly elevated on arrival.  Recheck in left arm 140/88 in the office.  Patient states typically his blood pressures at home when measured are 130s over 80s.  Advised patient to continue to check blood pressures and make sure to take his blood pressure early in the morning.  Give it time to enter the bloodstream usually 1 to 2 hours after taking the losartan.  Sit for 10 minutes.  Then check your blood pressure.  Log your blood pressures and bring them with you at your follow-up visit.  3. Dyslipidemia Patient's recent lipid panel showed a total cholesterol of 228, triglycerides 455, HDL 29, VLDL 80, LDL 119.  Start atorvastatin 10 mg p.o. daily.  Get lipid profile and LFTs in 6 to 8 weeks.   Medication Adjustments/Labs and Tests Ordered: Current medicines are reviewed at length with the patient today.  Concerns regarding medicines are outlined above.    There are no Patient Instructions on file for this visit.       Signed, Rennis Harding, NP 08/10/2019 1:49 PM      [facility] at [facility] [address] [place], [place], [place] [address] Phone: [phone]; Fax: [phone]

## 2019-08-10 NOTE — Patient Instructions (Addendum)
Medication Instructions:   START ATORVASTATIN 10 MG DAILY  CONTINUE LOSARTAN 50 MG DAILY  *If you need a refill on your cardiac medications before your next appointment, please call your pharmacy*  Lab Work: YOU WILL NEED TO HAVE LABS (BLOOD WORK) DRAWN IN 6-8 WEEKS:  FASTING LIPID PANEL-DO NOT EAT OR DRINK PAST MIDNIGHT (WATER OK TO DRINK)  If you have labs (blood work) drawn today and your tests are completely normal, you will receive your results only by: Marland Kitchen MyChart Message (if you have MyChart) OR . A paper copy in the mail If you have any lab test that is abnormal or we need to change your treatment, we will call you to review the results.  Testing/Procedures: NONE ordered at this time of appointment   Follow-Up: At [facility], you and your health needs are our priority.  As part of our continuing mission to provide you with exceptional heart care, we have created designated Provider Care Teams.  These Care Teams include your primary Cardiologist (physician) and Advanced Practice Providers (APPs -  Physician Assistants and Nurse Practitioners) who all work together to provide you with the care you need, when you need it.  Your next appointment:   2 month(s)  The format for your next appointment:   In Person  Provider:   You may see Peter Swaziland, MD or one of the following Advanced Practice Providers on your designated Care Team:    Azalee Course, PA-C  Micah Flesher, [place] or   Judy Pimple, [place]   Other Instructions

## 2019-10-02 NOTE — Progress Notes (Signed)
Cardiology Office Note  Date: 10/05/2019   ID: Dakota Joseph, Dakota Joseph 26-Jan-1961, MRN [id number]  PCP:  Patient, No Pcp Per  Cardiologist:  Diva Lemberger Martinique, MD Electrophysiologist:  None   Chief Complaint  Patient presents with  . Shortness of Breath  . Hypertension    History of Present Illness: Dakota Joseph is a 59 y.o. male seen for follow up chest pain. He has a history of borderline elevated blood pressure and hyperlipidemia.  Previous stress test in 2010.  Myoview images were normal he walked for 10 minutes on the Bruce protocol without chest pain or EKG changes.  He was seen again in 2017 complaining of dyspnea on exertion and some chest tightness.  Patient had an exercise treadmill demonstrating hypertensive response to exercise but unchanged from previous stress.  The symptoms were felt to be a result of poor conditioning.  Regular exercise and weight loss was recommended at that visit.  He was seen in November 2020 with symptoms of dyspnea on exertion.  He had gained 15 pounds, eating poorly, experiencing insomnia, dyspnea on ascending stairs. Stressed at work. He was started on losartan 50 mg daily for blood pressure.  A coronary CTA was ordered to evaluate chest pain and showed no significant CAD with calcium score of 0.   On follow up today he is feeling much better. He has a new job that is much less stressful. He is able to play golf regularly and exercise more. Working on losing weight.     Past Medical History:  Diagnosis Date  . Borderline high blood pressure   . Hypercholesterolemia   . Overweight     Past Surgical History:  Procedure Laterality Date  . KNEE SURGERY      Current Outpatient Medications  Medication Sig Dispense Refill  . atorvastatin (LIPITOR) 10 MG tablet Take 1 tablet (10 mg total) by mouth daily. 90 tablet 0  . ibuprofen (ADVIL,MOTRIN) 200 MG tablet Take 600 mg by mouth every 6 (six) hours as needed for moderate pain.    Marland Kitchen losartan  (COZAAR) 50 MG tablet Take 1 tablet (50 mg total) by mouth daily. 90 tablet 3   No current facility-administered medications for this visit.   Allergies:  Patient has no known allergies.   Social History: The patient  reports that he has never smoked. He has never used smokeless tobacco. He reports current alcohol use. He reports that he does not use drugs.   Family History: The patient's family history includes Hypertension in his mother; Lung cancer in his father.   ROS:  Please see the history of present illness. Otherwise, complete review of systems is positive for none.  All other systems are reviewed and negative.   Physical Exam: VS:  BP 140/70   Pulse 79   Temp (!) 96.8 F (36 C)   Ht 6\' 4"  (1.93 m)   Wt 284 lb 3.2 oz (128.9 kg)   SpO2 96%   BMI 34.59 kg/m , BMI Body mass index is 34.59 kg/m.  Wt Readings from Last 3 Encounters:  10/05/19 284 lb 3.2 oz (128.9 kg)  08/10/19 284 lb 9.6 oz (129.1 kg)  05/24/19 282 lb 3.2 oz (128 kg)    General: Patient appears comfortable at rest. Neck: Supple, no elevated JVP or carotid bruits, no thyromegaly. Lungs: Clear to auscultation, nonlabored breathing at rest. Cardiac: Regular rate and rhythm, no S3 or significant systolic murmur, no pericardial rub. Extremities: No pitting edema, distal pulses  2+. Skin: Warm and dry. Neuropsychiatric: Alert and oriented x3, affect grossly appropriate.   Recent Labwork: 05/24/2019: ALT 50; AST 28; BUN 13; Creatinine, Ser 1.04; Hemoglobin 16.7; Platelets 198; Potassium 4.6; Sodium 141; TSH 2.550     Component Value Date/Time   CHOL 228 (H) 05/24/2019 1537   TRIG 455 (H) 05/24/2019 1537   HDL 29 (L) 05/24/2019 1537   CHOLHDL 7.9 (H) 05/24/2019 1537   CHOLHDL 8.0 12/07/2008 0600   VLDL 37 12/07/2008 0600   LDLCALC 119 (H) 05/24/2019 1537    Other Studies Reviewed Today:   Coronary CT angiogram 07/27/2019   Pulmonary veins drain normally to the left atrium. No LA  appendage thrombus.  Calcium Score: 0 Agatston units.  Coronary Arteries: Right dominant with no anomalies  LM: No plaque or stenosis.  LAD system:  No plaque or stenosis.  Circumflex system: No plaque or stenosis.  RCA system: No plaque or stenosis.  IMPRESSION: 1. Coronary artery calcium score 0 Agatston units, suggesting low risk for future cardiac events.  2.  No significant coronary disease.  Assessment and Plan:  1. Essential hypertension   2. Dyslipidemia   3. Dyspnea on exertion    .1. Dyspnea on exertion No further dyspnea or chest pain.  Discussed recent coronary CT scan with patient which showed 0 calcium score and no evidence of coronary artery disease. This is very reassuring and indicates symptoms more related to stress and weight gain.  2. Essential hypertension Blood pressure is well controlled on losartan.   3. Dyslipidemia Follow up fasting lab work today on lipitor.    Medication Adjustments/Labs and Tests Ordered: Current medicines are reviewed at length with the patient today.  Concerns regarding medicines are outlined above.    There are no Patient Instructions on file for this visit.       Signed, Makiah Foye Swaziland MD, [facility]   10/05/2019 8:03 AM    [facility] at [facility]

## 2019-10-05 ENCOUNTER — Other Ambulatory Visit: Payer: Self-pay

## 2019-10-05 ENCOUNTER — Encounter: Payer: Self-pay | Admitting: Cardiology

## 2019-10-05 ENCOUNTER — Ambulatory Visit (INDEPENDENT_AMBULATORY_CARE_PROVIDER_SITE_OTHER): Payer: 59 | Admitting: Cardiology

## 2019-10-05 VITALS — BP 140/70 | HR 79 | Temp 96.8°F | Ht 76.0 in | Wt 284.2 lb

## 2019-10-05 DIAGNOSIS — E785 Hyperlipidemia, unspecified: Secondary | ICD-10-CM | POA: Diagnosis not present

## 2019-10-05 DIAGNOSIS — R0609 Other forms of dyspnea: Secondary | ICD-10-CM

## 2019-10-05 DIAGNOSIS — I10 Essential (primary) hypertension: Secondary | ICD-10-CM

## 2019-10-05 DIAGNOSIS — R0600 Dyspnea, unspecified: Secondary | ICD-10-CM | POA: Diagnosis not present

## 2019-10-05 LAB — BASIC METABOLIC PANEL
BUN/Creatinine Ratio: 13 (ref 9–20)
BUN: 12 mg/dL (ref 6–24)
CO2: 25 mmol/L (ref 20–29)
Calcium: 9.4 mg/dL (ref 8.7–10.2)
Chloride: 100 mmol/L (ref 96–106)
Creatinine, Ser: 0.95 mg/dL (ref 0.76–1.27)
GFR calc Af Amer: 103 mL/min/{1.73_m2} (ref >59)
GFR calc non Af Amer: 89 mL/min/{1.73_m2} (ref >59)
Glucose: 112 mg/dL — ABNORMAL HIGH (ref 65–99)
Potassium: 4.5 mmol/L (ref 3.5–5.2)
Sodium: 139 mmol/L (ref 134–144)

## 2019-10-05 LAB — LIPID PANEL
Chol/HDL Ratio: 6.4 ratio — ABNORMAL HIGH (ref 0.0–5.0)
Cholesterol, Total: 210 mg/dL — ABNORMAL HIGH (ref 100–199)
HDL: 33 mg/dL — ABNORMAL LOW (ref >39)
LDL Chol Calc (NIH): 133 mg/dL — ABNORMAL HIGH (ref 0–99)
Triglycerides: 247 mg/dL — ABNORMAL HIGH (ref 0–149)
VLDL Cholesterol Cal: 44 mg/dL — ABNORMAL HIGH (ref 5–40)

## 2019-10-05 LAB — HEPATIC FUNCTION PANEL
ALT: 70 IU/L — ABNORMAL HIGH (ref 0–44)
AST: 39 IU/L (ref 0–40)
Albumin: 4.5 g/dL (ref 3.8–4.9)
Alkaline Phosphatase: 84 IU/L (ref 39–117)
Bilirubin Total: 0.6 mg/dL (ref 0.0–1.2)
Bilirubin, Direct: 0.16 mg/dL (ref 0.00–0.40)
Total Protein: 6.9 g/dL (ref 6.0–8.5)

## 2019-10-05 NOTE — Addendum Note (Signed)
Addended by: Neoma Laming on: 10/05/2019 08:07 AM   Modules accepted: Orders

## 2020-03-15 ENCOUNTER — Telehealth: Payer: Self-pay | Admitting: Cardiology

## 2020-03-15 NOTE — Telephone Encounter (Signed)
Spoke to patient's wife.She stated husband tested positive for covid last Thurs 03/07/20.Dr.Jordan advised a zpac and prednisone  are not recommended.Advised to take tylenol,rest,drink plenty of fluids.Advised if symptoms worsen to go to ED.

## 2020-03-15 NOTE — Telephone Encounter (Signed)
No he will need to be seen by primary care or Urgent care. Should be Covid tested with these symptoms  Madelena Maturin Swaziland MD, [facility]

## 2020-03-15 NOTE — Telephone Encounter (Signed)
Patient's wife states the patient has covid and would like to know if Dr. Swaziland can call something in for his symptoms. She states he is very weak, tired, sweating, and coughing. She states his PCP retired and he has not found a new one yet.

## 2020-03-15 NOTE — Telephone Encounter (Signed)
Spoke with pt wife, she reports the patient has a cough, weakness and chills. Aware we are not prescribing anything, that they will need to use OTC medications. She reports the OTC meds are not working and that her niece 's doctor called in prednisone and a z-pak and it is helping them. She is wanting to know if dr Swaziland can prescribe prednisone and z-pak for patient. Will forward to dr Swaziland.

## 2020-03-18 ENCOUNTER — Ambulatory Visit
Admission: EM | Admit: 2020-03-18 | Discharge: 2020-03-18 | Disposition: A | Discharge status: Home / Self Care | Payer: 59 | Attending: Emergency Medicine | Admitting: Emergency Medicine

## 2020-03-18 ENCOUNTER — Other Ambulatory Visit: Payer: Self-pay

## 2020-03-18 DIAGNOSIS — R05 Cough: Secondary | ICD-10-CM | POA: Diagnosis not present

## 2020-03-18 DIAGNOSIS — U071 COVID-19: Secondary | ICD-10-CM | POA: Diagnosis not present

## 2020-03-18 DIAGNOSIS — Z20822 Contact with and (suspected) exposure to covid-19: Secondary | ICD-10-CM

## 2020-03-18 DIAGNOSIS — R058 Other specified cough: Secondary | ICD-10-CM

## 2020-03-18 MED ORDER — BENZONATATE 100 MG PO CAPS: 100.0000 mg | ORAL_CAPSULE | Freq: Three times a day (TID) | ORAL | 0 refills | Status: DC

## 2020-03-18 MED ORDER — PREDNISONE 10 MG PO TABS: 20.0000 mg | ORAL_TABLET | Freq: Every day | ORAL | 0 refills | Status: DC

## 2020-03-18 NOTE — Discharge Instructions (Signed)
Get plenty of rest and push fluids Tessalon Perles prescribed for cough Prednisone was prescribed Use medications daily for symptom relief Use OTC medications like ibuprofen or tylenol as needed fever or pain Call or go to the ED if you have any new or worsening symptoms such as fever, worsening cough, shortness of breath, chest tightness, chest pain, turning blue, changes in mental status, etc...  

## 2020-03-18 NOTE — ED Triage Notes (Signed)
Pt covid positive on 8/27 still has cough

## 2020-03-18 NOTE — ED Provider Notes (Signed)
[facility]   [id number] 03/18/20 Arrival Time: 1403   CC: COVID symptoms  SUBJECTIVE: History from: patient.  Dakota Joseph is a 59 y.o. male who presents presented to the urgent care for complaint of cough after tested positive for Covid on 03/08/2020.  Denies sick exposure to  flu or strep.  Denies recent travel.  Has tried OTC medication without relief.  Denies aggravating factors.   Denies fever, chills, fatigue, sinus pain, rhinorrhea, sore throat, SOB, wheezing, chest pain, nausea, changes in bowel or bladder habits.    ROS: As per HPI.  All other pertinent ROS negative.      Past Medical History:  Diagnosis Date  . Borderline high blood pressure   . Hypercholesterolemia   . Overweight    Past Surgical History:  Procedure Laterality Date  . KNEE SURGERY     No Known Allergies No current facility-administered medications on file prior to encounter.   Current Outpatient Medications on File Prior to Encounter  Medication Sig Dispense Refill  . atorvastatin (LIPITOR) 10 MG tablet Take 1 tablet (10 mg total) by mouth daily. 90 tablet 0  . ibuprofen (ADVIL,MOTRIN) 200 MG tablet Take 600 mg by mouth every 6 (six) hours as needed for moderate pain.    Marland Kitchen losartan (COZAAR) 50 MG tablet Take 1 tablet (50 mg total) by mouth daily. 90 tablet 3   Social History   Socioeconomic History  . Marital status: Married    Spouse name: Not on file  . Number of children: Not on file  . Years of education: Not on file  . Highest education level: Not on file  Occupational History  . Occupation: maintenance  Tobacco Use  . Smoking status: Never Smoker  . Smokeless tobacco: Never Used  Vaping Use  . Vaping Use: Never used  Substance and Sexual Activity  . Alcohol use: Yes  . Drug use: No  . Sexual activity: Not on file  Other Topics Concern  . Not on file  Social History Narrative  . Not on file   Social Determinants of Health   Financial Resource Strain:   .  Difficulty of Paying Living Expenses: Not on file  Food Insecurity:   . Worried About Programme researcher, broadcasting/film/video in the Last Year: Not on file  . Ran Out of Food in the Last Year: Not on file  Transportation Needs:   . Lack of Transportation (Medical): Not on file  . Lack of Transportation (Non-Medical): Not on file  Physical Activity:   . Days of Exercise per Week: Not on file  . Minutes of Exercise per Session: Not on file  Stress:   . Feeling of Stress : Not on file  Social Connections:   . Frequency of Communication with Friends and Family: Not on file  . Frequency of Social Gatherings with Friends and Family: Not on file  . Attends Religious Services: Not on file  . Active Member of Clubs or Organizations: Not on file  . Attends Banker Meetings: Not on file  . Marital Status: Not on file  Intimate Partner Violence:   . Fear of Current or Ex-Partner: Not on file  . Emotionally Abused: Not on file  . Physically Abused: Not on file  . Sexually Abused: Not on file   Family History  Problem Relation Age of Onset  . Hypertension Mother        afib  . Lung cancer Father     OBJECTIVE:  Vitals:   03/18/20 1510  BP: 115/79  Pulse: 77  Resp: 17  Temp: 98.3 F (36.8 C)  TempSrc: Oral     General appearance: alert; appears fatigued, but nontoxic; speaking in full sentences and tolerating own secretions HEENT: NCAT; Ears: EACs clear, TMs pearly gray; Eyes: PERRL.  EOM grossly intact. Sinuses: nontender; Nose: nares patent without rhinorrhea, Throat: oropharynx clear, tonsils non erythematous or enlarged, uvula midline  Neck: supple without LAD Lungs: unlabored respirations, symmetrical air entry; cough:present mild; no respiratory distress; CTAB Heart: regular rate and rhythm.  Radial pulses 2+ symmetrical bilaterally Skin: warm and dry Psychological: alert and cooperative; normal mood and affect  LABS:  No results found for this or any previous visit (from the  past 24 hour(s)).   ASSESSMENT & PLAN:  1. Cough with exposure to COVID-19 virus   2. COVID-19 virus infection     Meds ordered this encounter  Medications  . predniSONE (DELTASONE) 10 MG tablet    Sig: Take 2 tablets (20 mg total) by mouth daily.    Dispense:  15 tablet    Refill:  0  . benzonatate (TESSALON) 100 MG capsule    Sig: Take 1 capsule (100 mg total) by mouth every 8 (eight) hours.    Dispense:  30 capsule    Refill:  0    Discharge Instructions.    Get plenty of rest and push fluids Tessalon Perles prescribed for cough Prednisone was prescribed Use medications daily for symptom relief Use OTC medications like ibuprofen or tylenol as needed fever or pain Call or go to the ED if you have any new or worsening symptoms such as fever, worsening cough, shortness of breath, chest tightness, chest pain, turning blue, changes in mental status, etc...   Reviewed expectations re: course of current medical issues. Questions answered. Outlined signs and symptoms indicating need for more acute intervention. Patient verbalized understanding. After Visit Summary given.      Note: This document was prepared using Dragon voice recognition software and may include unintentional dictation errors.    Durward Parcel, FNP 03/18/20 1552

## 2021-08-08 ENCOUNTER — Emergency Department (HOSPITAL_BASED_OUTPATIENT_CLINIC_OR_DEPARTMENT_OTHER): Payer: 59

## 2021-08-08 ENCOUNTER — Encounter (HOSPITAL_BASED_OUTPATIENT_CLINIC_OR_DEPARTMENT_OTHER): Payer: Self-pay | Admitting: Emergency Medicine

## 2021-08-08 ENCOUNTER — Other Ambulatory Visit: Payer: Self-pay

## 2021-08-08 ENCOUNTER — Emergency Department (HOSPITAL_BASED_OUTPATIENT_CLINIC_OR_DEPARTMENT_OTHER)
Admission: EM | Admit: 2021-08-08 | Discharge: 2021-08-08 | Disposition: A | Discharge status: Home / Self Care | Payer: 59 | Attending: Emergency Medicine | Admitting: Emergency Medicine

## 2021-08-08 DIAGNOSIS — I10 Essential (primary) hypertension: Secondary | ICD-10-CM | POA: Diagnosis not present

## 2021-08-08 DIAGNOSIS — Z7982 Long term (current) use of aspirin: Secondary | ICD-10-CM | POA: Diagnosis not present

## 2021-08-08 DIAGNOSIS — R072 Precordial pain: Secondary | ICD-10-CM | POA: Diagnosis not present

## 2021-08-08 DIAGNOSIS — R079 Chest pain, unspecified: Secondary | ICD-10-CM

## 2021-08-08 DIAGNOSIS — R7309 Other abnormal glucose: Secondary | ICD-10-CM | POA: Diagnosis not present

## 2021-08-08 LAB — CBC WITH DIFFERENTIAL/PLATELET
Abs Immature Granulocytes: 0.03 10*3/uL (ref 0.00–0.07)
Basophils Absolute: 0 10*3/uL (ref 0.0–0.1)
Basophils Relative: 0 %
Eosinophils Absolute: 0.2 10*3/uL (ref 0.0–0.5)
Eosinophils Relative: 2 %
HCT: 45.3 % (ref 39.0–52.0)
Hemoglobin: 15.6 g/dL (ref 13.0–17.0)
Immature Granulocytes: 0 %
Lymphocytes Relative: 31 %
Lymphs Abs: 3 10*3/uL (ref 0.7–4.0)
MCH: 29.8 pg (ref 26.0–34.0)
MCHC: 34.4 g/dL (ref 30.0–36.0)
MCV: 86.6 fL (ref 80.0–100.0)
Monocytes Absolute: 0.9 10*3/uL (ref 0.1–1.0)
Monocytes Relative: 9 %
Neutro Abs: 5.6 10*3/uL (ref 1.7–7.7)
Neutrophils Relative %: 58 %
Platelets: 188 10*3/uL (ref 150–400)
RBC: 5.23 MIL/uL (ref 4.22–5.81)
RDW: 11.9 % (ref 11.5–15.5)
WBC: 9.7 10*3/uL (ref 4.0–10.5)
nRBC: 0 % (ref 0.0–0.2)

## 2021-08-08 LAB — TROPONIN I (HIGH SENSITIVITY)
Troponin I (High Sensitivity): 3 ng/L (ref <18)
Troponin I (High Sensitivity): 2 ng/L (ref <18)

## 2021-08-08 LAB — BASIC METABOLIC PANEL
Anion gap: 8 (ref 5–15)
BUN: 15 mg/dL (ref 6–20)
CO2: 27 mmol/L (ref 22–32)
Calcium: 9.3 mg/dL (ref 8.9–10.3)
Chloride: 103 mmol/L (ref 98–111)
Creatinine, Ser: 0.9 mg/dL (ref 0.61–1.24)
GFR, Estimated: >60 mL/min (ref >60)
Glucose, Bld: 122 mg/dL — ABNORMAL HIGH (ref 70–99)
Potassium: 3.9 mmol/L (ref 3.5–5.1)
Sodium: 138 mmol/L (ref 135–145)

## 2021-08-08 MED ORDER — PANTOPRAZOLE SODIUM 40 MG PO TBEC: 40.0000 mg | DELAYED_RELEASE_TABLET | Freq: Every day | ORAL | 0 refills | Status: DC

## 2021-08-08 MED ORDER — ASPIRIN 81 MG PO CHEW
324.0000 mg | CHEWABLE_TABLET | Freq: Once | ORAL | Status: DC
  Filled 2021-08-08: qty 4

## 2021-08-08 MED ORDER — NITROGLYCERIN 0.4 MG SL SUBL
0.4000 mg | SUBLINGUAL_TABLET | SUBLINGUAL | Status: AC | PRN
  Administered 2021-08-08 (×3): 0.4 mg via SUBLINGUAL
  Filled 2021-08-08: qty 1

## 2021-08-08 MED ORDER — PANTOPRAZOLE SODIUM 40 MG PO TBEC
40.0000 mg | DELAYED_RELEASE_TABLET | Freq: Once | ORAL | Status: AC
Start: 2021-08-08 — End: 2021-08-08
  Administered 2021-08-08: 40 mg via ORAL
  Filled 2021-08-08: qty 1

## 2021-08-08 MED ORDER — ASPIRIN 81 MG PO CHEW
162.0000 mg | CHEWABLE_TABLET | Freq: Once | ORAL | Status: AC
  Administered 2021-08-08: 162 mg via ORAL

## 2021-08-08 NOTE — ED Triage Notes (Signed)
°  Patient comes in with mid chest/epigastric pain that started about an hour ago.  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 No N/V, diaphoresis, radiating pain, or SOB. Patient took 2 baby ASA and drank some milk before coming in.  Pain 6/10.

## 2021-08-08 NOTE — ED Provider Notes (Signed)
[facility] EMERGENCY DEPT Provider Note   CSN: [id number] Arrival date & time: 08/08/21  0350     History  Chief Complaint  Patient presents with   Chest Pain    Dakota Joseph is a 61 y.o. male.  The history is provided by the patient.  Chest Pain He has history of hypertension, hyperlipidemia and comes in because of chest pain which woke him up at about 2 AM.  He relates this to having eaten a jalapeno burger earlier in the evening.  Pain is in the midsternal area without radiation.  It is a dull pain and he rated it at 8/10.  It is improved slightly and is now down to 6/10.  There is no associated dyspnea, nausea, diaphoresis.  Nothing made the pain better, nothing made it worse.  He tried drinking some milk without any change.  He did take aspirin 162 mg.  He is a non-smoker and there is no history of diabetes and there is no family history of premature coronary atherosclerosis.   Home Medications Prior to Admission medications   Medication Sig Start Date End Date Taking? Authorizing Provider  atorvastatin (LIPITOR) 10 MG tablet Take 1 tablet (10 mg total) by mouth daily. 08/10/19 11/08/19  Netta Neat., NP  benzonatate (TESSALON) 100 MG capsule Take 1 capsule (100 mg total) by mouth every 8 (eight) hours. 03/18/20   Avegno, Zachery Dakins, FNP  ibuprofen (ADVIL,MOTRIN) 200 MG tablet Take 600 mg by mouth every 6 (six) hours as needed for moderate pain.    [provider]  losartan (COZAAR) 50 MG tablet Take 1 tablet (50 mg total) by mouth daily. 05/24/19 05/18/20  [place], Peter M, MD  predniSONE (DELTASONE) 10 MG tablet Take 2 tablets (20 mg total) by mouth daily. 03/18/20   Avegno, Zachery Dakins, FNP      Allergies    Patient has no known allergies.    Review of Systems   Review of Systems  Cardiovascular:  Positive for chest pain.  All other systems reviewed and are negative.  Physical Exam Updated Vital Signs BP (!) 183/110 (BP Location: Left Arm)     Pulse 78    Temp 98.5 F (36.9 C) (Oral)    Resp 18    Ht 6\' 4"  (1.93 m)    Wt 108.4 kg    SpO2 99%    BMI 29.09 kg/m  Physical Exam Vitals and nursing note reviewed.  61 year old male, resting comfortably and in no acute distress. Vital signs are significant for elevated blood pressure. Oxygen saturation is 99%, which is normal. Head is normocephalic and atraumatic. PERRLA, EOMI. Oropharynx is clear. Neck is nontender and supple without adenopathy or JVD. Back is nontender and there is no CVA tenderness. Lungs are clear without rales, wheezes, or rhonchi. Chest is nontender. Heart has regular rate and rhythm without murmur. Abdomen is soft, flat, nontender. Extremities have no cyanosis or edema, full range of motion is present. Skin is warm and dry without rash. Neurologic: Mental status is normal, cranial nerves are intact, moves all extremities equally.  ED Results / Procedures / Treatments   Labs (all labs ordered are listed, but only abnormal results are displayed) Labs Reviewed  BASIC METABOLIC PANEL - Abnormal; Notable for the following components:      Result Value   Glucose, Bld 122 (*)    All other components within normal limits  CBC WITH DIFFERENTIAL/PLATELET  TROPONIN I (HIGH SENSITIVITY)  TROPONIN I (  HIGH SENSITIVITY)    EKG EKG Interpretation  Date/Time:  Friday August 08 2021 03:13:24 EST Ventricular Rate:  85 PR Interval:  145 QRS Duration: 101 QT Interval:  367 QTC Calculation: 437 R Axis:   67 Text Interpretation: Sinus rhythm Minimal ST depression, diffuse leads When compared with ECG of 12/07/2008, ST abnormality is now present When compared with ECG of 05/24/2019, No significant change was found Reconfirmed by Dione Booze ([id number]) on 08/08/2021 3:41:25 AM  Radiology DG Chest Port 1 View  Result Date: 08/08/2021 CLINICAL DATA:  Chest pain. EXAM: PORTABLE CHEST 1 VIEW COMPARISON:  Chest x-ray 12/06/2008 FINDINGS: The heart size and mediastinal  contours are within normal limits. Both lungs are clear. The visualized skeletal structures are unremarkable. IMPRESSION: No active disease. Electronically Signed   By: Darliss Cheney M.D.   On: 08/08/2021 03:49    Procedures Procedures  Cardiac monitor shows normal sinus rhythm per my interpretation.  Medications Ordered in ED Medications - No data to display  ED Course/ Medical Decision Making/ A&P                           Medical Decision Making Amount and/or Complexity of Data Reviewed Labs: ordered. Radiology: ordered.  Risk OTC drugs. Prescription drug management.   Chest pain of uncertain cause.  Certainly, there is concern for possible ACS, consider GERD.  No risk factors for pulmonary embolism.  Prior CT scan of chest showed no evidence of thoracic aneurysm, aortic dissection very unlikely.  Old records are reviewed, and he had been evaluated by cardiology for chest pain.  Coronary CT scan done on 07/31/2019 showed no coronary artery disease, no coronary artery calcification, no evidence of thoracic aortic aneurysm.  ECG today is unchanged from ECG in 2020.  Given negative coronary CT scan 2 years ago, pain is most likely secondary to GERD.  However, he will get a therapeutic trial of nitroglycerin and will check troponin x2.  Chest x-ray shows no acute disease.  I have independently viewed the image and agree with radiologist's interpretation.  His pain was completely relieved with nitroglycerin, remained pain-free in the emergency department.  Troponin is normal x2.  Other labs are significant for glucose of 122, which is in the prediabetic range.  Patient is advised of this finding.  At this point, he is felt to be safe for discharge, pain is felt to be most likely secondary to GERD.  He is referred back to his cardiologist, given a prescription for pantoprazole.  Return precautions discussed.        Final Clinical Impression(s) / ED Diagnoses Final diagnoses:   Nonspecific chest pain  Elevated random blood glucose level    Rx / DC Orders ED Discharge Orders          Ordered    pantoprazole (PROTONIX) 40 MG tablet  Daily        08/08/21 0613              Dione Booze, MD 08/08/21 [phone]

## 2021-08-08 NOTE — Discharge Instructions (Signed)
Your evaluation tonight did not show any sign of any heart problem.  However, your blood sugar was slightly high.  This will need to be followed closely to make sure you do not develop diabetes.  Please make a follow-up appointment with your cardiologist.  Return if you have any new or concerning symptoms.

## 2021-08-12 NOTE — Progress Notes (Signed)
Office Visit    Patient Name: Dakota Joseph Date of Encounter: 08/13/2021  Primary Care Provider:  Patient, No Pcp Per (Inactive) Primary Cardiologist:  Peter [place], MD  Chief Complaint    61 year old male with a history of atypical chest pain, hypertension, hyperlipidemia, GERD, and obesity who presents for follow-up related to hypertension.  Past Medical History    Past Medical History:  Diagnosis Date   Borderline high blood pressure    Hypercholesterolemia    Overweight    Past Surgical History:  Procedure Laterality Date   KNEE SURGERY      Allergies  No Known Allergies  History of Present Illness    61 year old male with the above past medical history including atypical chest pain, hypertension, hyperlipidemia, GERD, and obesity.  He has had a history of atypical chest pain.  Previous stress test in 2010 was negative for ischemia. Repeat exercise treadmill in the setting of dyspnea on exertion and chest tightness in 2017 was unchanged.  Overall, his symptoms were thought to be related to deconditioning, regular exercise and weight loss was recommended.  He complained of ongoing dyspnea on exertion at his follow-up visit in November 2020.  Coronary CTA in January 2021 showed no significant CAD, calcium score 0.  He was last seen in the office March 2021 and was stable from a cardiac standpoint.  He denied any symptoms concerning for angina.    Unfortunately, he presented to the ED on 08/08/2021 with complaints of dull non-radiating mid-sternal chest pain that occurred after eating a jalapeno burger earlier that evening.  EKG was unremarkable, troponin was negative x2. His symptoms improved, and were thought to be secondary to GERD. He was started on Protonix, and discharged home in stable condition. He presents today for follow-up. Since his ED visit reports an overall improvement in his symptoms, thought he does still have some mild acid reflux. He is taking  Protonix daily. He states that his chest discomfort is similar to the discomfort he felt when he was diagnosed with GERD. He saw a GI doctor for this in the past.  He does not have a PCP. Notably, he intentionally lost a significant amount of weight over the past two years and his blood pressure is much improved, he is no longer taking his losartan. Additionally, he ran out of his prescription for his Lipitor. He is active, exercises regularly and plays golf frequently. He denies any symptoms concerning for angina. Overall, he reports feeling well and other than his lingering reflux, he denies any additional concerns or complaints today.   Home Medications    Current Outpatient Medications  Medication Sig Dispense Refill   atorvastatin (LIPITOR) 20 MG tablet Take 1 tablet (20 mg total) by mouth daily. 90 tablet 3   ibuprofen (ADVIL,MOTRIN) 200 MG tablet Take 600 mg by mouth every 6 (six) hours as needed for moderate pain.     losartan (COZAAR) 50 MG tablet Take 1 tablet (50 mg total) by mouth daily. 90 tablet 3   pantoprazole (PROTONIX) 40 MG tablet Take 1 tablet (40 mg total) by mouth daily. 30 tablet 11   No current facility-administered medications for this visit.     Review of Systems    He denies chest pain, palpitations, dyspnea, pnd, orthopnea, n, v, dizziness, syncope, edema, weight gain, or early satiety. All other systems reviewed and are otherwise negative except as noted above.   Physical Exam    VS:  BP 118/70  Pulse 80    Ht 6\' 4"  (1.93 m)    Wt 243 lb 12.8 oz (110.6 kg)    SpO2 96%    BMI 29.68 kg/m  GEN: Well nourished, well developed, in no acute distress. HEENT: normal. Neck: Supple, no JVD, carotid bruits, or masses. Cardiac: RRR, no murmurs, rubs, or gallops. No clubbing, cyanosis, edema.  Radials/DP/PT 2+ and equal bilaterally.  Respiratory:  Respirations regular and unlabored, clear to auscultation bilaterally. GI: Soft, nontender, nondistended, BS + x 4. MS: no  deformity or atrophy. Skin: warm and dry, no rash. Neuro:  Strength and sensation are intact. Psych: Normal affect.  Accessory Clinical Findings    ECG personally reviewed by me today - No EKG in office today - no acute changes.  Lab Results  Component Value Date   WBC 9.7 08/08/2021   HGB 15.6 08/08/2021   HCT 45.3 08/08/2021   MCV 86.6 08/08/2021   PLT 188 08/08/2021   Lab Results  Component Value Date   CREATININE 0.90 08/08/2021   BUN 15 08/08/2021   NA 138 08/08/2021   K 3.9 08/08/2021   CL 103 08/08/2021   CO2 27 08/08/2021   Lab Results  Component Value Date   ALT 70 (H) 10/05/2019   AST 39 10/05/2019   ALKPHOS 84 10/05/2019   BILITOT 0.6 10/05/2019   Lab Results  Component Value Date   CHOL 210 (H) 10/05/2019   HDL 33 (L) 10/05/2019   LDLCALC 133 (H) 10/05/2019   TRIG 247 (H) 10/05/2019   CHOLHDL 6.4 (H) 10/05/2019    No results found for: HGBA1C  Assessment & Plan    1. Atypical chest pain:  Previous stress test in 2010 was negative for ischemia.  Repeat exercise treadmill in the setting of dyspnea on exertion and chest tightness in 2017 negative. Coronary CTA in January 2021 showed no significant CAD, calcium score 0.  To ED 08/08/2021 with dull, non-radiating, mid-sternal chest pain that occurred after eating a jalapeno burger.  EKG was unremarkable, troponin was negative x2. He denies symptoms concerning for angina, denies exertional symptoms. No indication for ischemic evaluation at this time. Overall, his symptoms have improved with Protonix though he does note some lingering reflux. Chest discomfort likely in the setting of GERD, which he does have a history of and has seen GI in the past.  He does not have a PCP at this time. I offered to provide a list of PCPs in the area, however, he states he will pursue this on his own. Will refer to GI for further evaluation of GERD symptoms.   2. GERD: Likely the culprit for his recent symptoms of chest discomfort  as above.  He has noted some improvement on Protonix.  Will refer to GI as above.  3. Hypertension: BP well controlled. He is no longer taking losartan in the setting of recent intentional weight loss and improved blood pressures. Ok to remain off Losartan as long as BP remains < 130/80. He verbalized understanding.    4. Hyperlipidemia: LDL was 118 in 2020, no lipid profile since. He is no longer on statin as his prescription ran out. The 10-year ASCVD risk score (Arnett DK, et al., 2019) is: 9.2%. Will start Lipitor 20 mg daily. Repeat lipids, LFTs in 6-8 weeks.   5. Obesity: He has lost 40 pounds in the last 2 years. He continues to work on weight loss with diet and exercise. I congratulated him on this and encouraged his continued  progress.   6. Disposition: F/u in 6 months.   Joylene GrapesEmily C Bretton Tandy, NP 08/13/2021, 3:26 PM

## 2021-08-13 ENCOUNTER — Ambulatory Visit: Payer: 59 | Admitting: Nurse Practitioner

## 2021-08-13 ENCOUNTER — Other Ambulatory Visit: Payer: Self-pay

## 2021-08-13 ENCOUNTER — Encounter: Payer: Self-pay | Admitting: Physician Assistant

## 2021-08-13 VITALS — BP 118/70 | HR 80 | Ht 76.0 in | Wt 243.8 lb

## 2021-08-13 DIAGNOSIS — I10 Essential (primary) hypertension: Secondary | ICD-10-CM

## 2021-08-13 DIAGNOSIS — K219 Gastro-esophageal reflux disease without esophagitis: Secondary | ICD-10-CM | POA: Diagnosis not present

## 2021-08-13 DIAGNOSIS — R0789 Other chest pain: Secondary | ICD-10-CM | POA: Diagnosis not present

## 2021-08-13 DIAGNOSIS — E785 Hyperlipidemia, unspecified: Secondary | ICD-10-CM

## 2021-08-13 DIAGNOSIS — E669 Obesity, unspecified: Secondary | ICD-10-CM

## 2021-08-13 MED ORDER — PANTOPRAZOLE SODIUM 40 MG PO TBEC: 40.0000 mg | DELAYED_RELEASE_TABLET | Freq: Every day | ORAL | 11 refills | Status: AC

## 2021-08-13 MED ORDER — ATORVASTATIN CALCIUM 20 MG PO TABS: 20.0000 mg | ORAL_TABLET | Freq: Every day | ORAL | 3 refills | Status: AC

## 2021-08-13 NOTE — Patient Instructions (Addendum)
Medication Instructions:  Stop Lipitor 10 mg daily Start Lipitor 20 mg daily  *If you need a refill on your cardiac medications before your next appointment, please call your pharmacy*   Lab Work: Your physician recommends that you return for Fasting lab work in 6-8 weeks Lipid HFP  If you have labs (blood work) drawn today and your tests are completely normal, you will receive your results only by: MyChart Message (if you have MyChart) OR A paper copy in the mail If you have any lab test that is abnormal or we need to change your treatment, we will call you to review the results.   Testing/Procedures: NONE ordered at this time of appointment     Follow-Up: At [facility], you and your health needs are our priority.  As part of our continuing mission to provide you with exceptional heart care, we have created designated Provider Care Teams.  These Care Teams include your primary Cardiologist (physician) and Advanced Practice Providers (APPs -  Physician Assistants and Nurse Practitioners) who all work together to provide you with the care you need, when you need it.  We recommend signing up for the patient portal called "MyChart".  Sign up information is provided on this After Visit Summary.  MyChart is used to connect with patients for Virtual Visits (Telemedicine).  Patients are able to view lab/test results, encounter notes, upcoming appointments, etc.  Non-urgent messages can be sent to your provider as well.   To learn more about what you can do with MyChart, go to [email or web address].    Your next appointment:   6 month(s)  The format for your next appointment:   In Person  Provider:   Peter [place], MD     Other Instructions Primary Care Physician information given Referral to GI for Reflux

## 2021-12-11 ENCOUNTER — Encounter: Payer: Self-pay | Admitting: Nurse Practitioner

## 2022-09-11 DEATH — deceased
